# Patient Record
Sex: Male | Born: 1980 | Race: White | Hispanic: No | Smoking: Current every day smoker
Health system: Southern US, Community
[De-identification: ages and names within clinical notes are randomized; demographics above are authoritative.]

## PROBLEM LIST (undated history)

## (undated) DIAGNOSIS — F319 Bipolar disorder, unspecified: Secondary | ICD-10-CM

## (undated) DIAGNOSIS — F419 Anxiety disorder, unspecified: Secondary | ICD-10-CM

## (undated) DIAGNOSIS — J45909 Unspecified asthma, uncomplicated: Secondary | ICD-10-CM

## (undated) DIAGNOSIS — K219 Gastro-esophageal reflux disease without esophagitis: Secondary | ICD-10-CM

## (undated) DIAGNOSIS — E039 Hypothyroidism, unspecified: Secondary | ICD-10-CM

## (undated) DIAGNOSIS — I10 Essential (primary) hypertension: Secondary | ICD-10-CM

## (undated) DIAGNOSIS — E785 Hyperlipidemia, unspecified: Secondary | ICD-10-CM

## (undated) DIAGNOSIS — E079 Disorder of thyroid, unspecified: Secondary | ICD-10-CM

## (undated) DIAGNOSIS — F329 Major depressive disorder, single episode, unspecified: Secondary | ICD-10-CM

## (undated) DIAGNOSIS — F32A Depression, unspecified: Secondary | ICD-10-CM

## (undated) HISTORY — DX: Hyperlipidemia, unspecified: E78.5

## (undated) HISTORY — DX: Bipolar disorder, unspecified: F31.9

## (undated) HISTORY — PX: TONSILLECTOMY: SUR1361

## (undated) HISTORY — DX: Gastro-esophageal reflux disease without esophagitis: K21.9

---

## 2002-03-16 ENCOUNTER — Encounter: Admission: RE | Admit: 2002-03-16 | Discharge: 2002-06-14 | Payer: Self-pay | Admitting: Family Medicine

## 2004-02-08 ENCOUNTER — Emergency Department (HOSPITAL_COMMUNITY): Admission: EM | Admit: 2004-02-08 | Discharge: 2004-02-08 | Payer: Self-pay | Admitting: Emergency Medicine

## 2004-10-28 ENCOUNTER — Inpatient Hospital Stay (HOSPITAL_COMMUNITY): Admission: EM | Admit: 2004-10-28 | Discharge: 2004-10-31 | Payer: Self-pay | Admitting: Psychiatry

## 2004-10-28 ENCOUNTER — Ambulatory Visit: Payer: Self-pay | Admitting: Psychiatry

## 2006-03-06 ENCOUNTER — Emergency Department (HOSPITAL_COMMUNITY): Admission: EM | Admit: 2006-03-06 | Discharge: 2006-03-06 | Payer: Self-pay | Admitting: Emergency Medicine

## 2006-07-05 ENCOUNTER — Inpatient Hospital Stay (HOSPITAL_COMMUNITY): Admission: EM | Admit: 2006-07-05 | Discharge: 2006-07-08 | Payer: Self-pay | Admitting: Emergency Medicine

## 2006-10-04 ENCOUNTER — Ambulatory Visit: Payer: Self-pay | Admitting: Internal Medicine

## 2006-10-06 ENCOUNTER — Ambulatory Visit: Payer: Self-pay | Admitting: *Deleted

## 2006-11-16 ENCOUNTER — Ambulatory Visit: Payer: Self-pay | Admitting: Internal Medicine

## 2007-03-02 ENCOUNTER — Ambulatory Visit: Payer: Self-pay | Admitting: Family Medicine

## 2007-12-29 ENCOUNTER — Ambulatory Visit: Payer: Self-pay | Admitting: Internal Medicine

## 2007-12-29 LAB — CONVERTED CEMR LAB: TSH: 2.297 microintl units/mL (ref 0.350–4.50)

## 2008-05-02 ENCOUNTER — Emergency Department (HOSPITAL_COMMUNITY): Admission: EM | Admit: 2008-05-02 | Discharge: 2008-05-02 | Payer: Self-pay | Admitting: Family Medicine

## 2008-09-13 ENCOUNTER — Emergency Department (HOSPITAL_COMMUNITY): Admission: EM | Admit: 2008-09-13 | Discharge: 2008-09-13 | Payer: Self-pay | Admitting: Family Medicine

## 2008-10-10 ENCOUNTER — Emergency Department (HOSPITAL_COMMUNITY): Admission: EM | Admit: 2008-10-10 | Discharge: 2008-10-10 | Payer: Self-pay | Admitting: Family Medicine

## 2010-08-15 NOTE — Discharge Summary (Signed)
Zachary Dean, Zachary Dean NO.:  [id number]   MEDICAL RECORD NO.:  [id number]          PATIENT TYPE:  INP   LOCATION:  1415                         FACILITY:  [name]   PHYSICIAN:  Lonia Blood, M.D.      DATE OF BIRTH:  [birth year]   DATE OF ADMISSION:  07/05/2006  DATE OF DISCHARGE:  07/08/2006                               DISCHARGE SUMMARY   Primary care physician:  The patient was unassigned to [place].   DISCHARGE DIAGNOSES:  1. Severe depression and agitation with suicidal ideation.  2. Hypothyroidism.  3. Hypertension.   DISCHARGE MEDICATIONS:  1. Alprazolam 1-2 mg p.r.n.  2. Fluoxetine 20 mg daily.  3. Ibuprofen p.r.n.  4. Levothyroxine 50 mcg daily.  5. Wellbutrin.  6. Lamictal.  7. Dextroamphetamine 20 mg q.i.d.  8. Trileptal 12 mg q.h.s.   DISPOSITION:  The patient was transferred to inpatient psychiatric care  voluntarily, but he was not committable.  The patient opted to consider  going on his own.   PROCEDURES PERFORMED:  1. Head CT without contrast on April 7 showed no acute findings.  2. Chest x-ray on April 7 also showed no acute findings.   CONSULTATIONS:  [name], M.D., psychiatry.   BRIEF HISTORY AND PHYSICAL:  Please refer to dictated history and  physical on admission by [facility]. [name], MD.  In short, however,  this is a 30 year old male with known history of some psychiatric  disorder, mainly bipolar, and also polysubstance abuse.  The patient  came in secondary to falling down, continuous confusion.  The patient  while in this state started becoming more or less combative.  There was  no suicidal ideation in the beginning but there was suggestion from his  fiancee that he was extremely depressed.  The patient was subsequently  admitted for further management.   HOSPITAL COURSE:  Problem 1.  SEVERE DEPRESSION, AGITATION:  The patient continued to have  some confusion while in the hospital.  He was seen by psychiatry and his  medications were tampered with.  He was diagnosed with bipolar disorder  by Dr. [name] again.  The initial plan was to commit him for  inpatient psychiatry, but the patient improved on his own without much  problem.  As such, he was given the option of voluntary admission, which  he seemed to have declined.  He was subsequently discharged home to  follow up with inpatient psychiatry.   Problem 2.  HYPOTHYROIDISM:  His thyroid tests were essentially normal  and the patient continues on his home medications.   Problem 3.  HYPERTENSION:  Blood pressure was well-controlled this  hospitalization.   Problem 4.  Other medical problems were also stable this  hospitalization.      Lonia Blood, M.D.  Electronically Signed     LG/MEDQ  D:  08/08/2006  T:  08/09/2006  Job:  [id number]

## 2010-08-15 NOTE — H&P (Signed)
NAMEDANTONIO, JUSTEN NO.:  [id number]   MEDICAL RECORD NO.:  [id number]          PATIENT TYPE:  INP   LOCATION:  1415                         FACILITY:  [facility]   PHYSICIAN:  Beckey Rutter, MD  DATE OF BIRTH:  09-Apr-1980   DATE OF ADMISSION:  07/05/2006  DATE OF DISCHARGE:                              HISTORY & PHYSICAL   PRIMARY CARE PHYSICIAN:  Dr. Verner Chol.   PRIMARY PSYCHIATRIST:  Dr. Clare Gandy.   CHIEF COMPLAINT:  Patient fell down/confusion.   HISTORY OF PRESENT ILLNESS:  This is a 30 year old male with a past  medical history suggestive for psychiatric disorder/bipolar, anxiety,  alcohol abuse, depression, hypertension, and hyperthyroidism, brought  today by his fiancee because of increasing confusion.  She also stated  to the ER physician that the patient fell down.  She was concerned about  a head injury.  Upon questioning the patient himself, he is not sure why  he is in the hospital.  He seems sleepy and not willing to answer  questions regarding history.  No further history could be obtained at  this time.   PAST MEDICAL HISTORY:  1. Alcohol abuse.  2. Anxiety.  3. Bipolar disorder.  4. Depression.  5. Hypertension.  6. Hypothyroidism.   FAMILY HISTORY:  Unremarkable.   SOCIAL HISTORY:  He is a social drinker.  Denies alcohol abuse or  tobacco abuse.   DRUG ALLERGIES:  Not known to have drug allergies.   MEDICATIONS:  1. Alprazolam 1-2 mg daily.  2. _ amphetamines 20 mg four times a day.  3. Fluoxetine 20 mg every morning.  4. Ibuprofen as needed.  5. Levothyroxine 50 mcg daily.  6. Trilafon 12 mg at bedtime.  7. Multivitamin 1 tab daily.  8. Patient used to take Wellbutrin and Lamictal but he ran out of      those two medications for one month.   REVIEW OF SYSTEMS:  Unremarkable.   PHYSICAL EXAMINATION:  VITAL SIGNS:  Temperature 97.7, pulse 97,  respiratory rate 24, blood pressure 133/34.  GENERAL:  He is lying flat, not in  acute distress.  HEENT:  Head is normocephalic and atraumatic.  Eyes:  PERRL.  Mouth:  No  ulcers.  NECK:  No thyroid enlargement.  No lymph node enlargement.  No JVD.  CHEST:  Bilateral fair air entry.  CARDIAC:  Precordial exam:  First and second heart sounds audible.  No  added sounds.  ABDOMEN:  Soft and nontender.  Bowel sounds present.  GU:  No CVA tenderness.  EXTREMITIES:  No edema in the lower extremities.  SKIN:  No hyperpigmentation.  NEUROLOGIC:  Patient seems sleepy.  He is not willing to answer  questions, and he is taking a long time to answer simple questions,  although he does not seem confused, and he answers properly to most of  the questions.  He moves all of his extremities spontaneously.   LABS/X-RAY:  EKG showing tachycardia.  Heart rate 102, sinus rhythm.  No  ST/T changes.   CT scan showing the impression of no acute intracranial pathology.   Sodium 141, potassium  4.1, chloride 104, bicarb 30, BUN 18, creatinine  1.4, glucose 159.  Urine drug screen positive for benzodiazepines and  positive for amphetamines.  Ethanol less than 5%.  Salicylate less than  10%.  Urinalysis essentially negative.   IMPRESSION/RECOMMENDATIONS:  This is a 30 year old man with  hypertension, psychotic disorder, and alcohol abuse, who came today with  confusion.  Likely, the confusion because of the increasing medications,  although there is no suicidal ideation reported at this time.   PLAN:  Patient will be admitted for further assessment and management.  Patient will be admitted to the medical floor.  We will hold his  antipsychotic medication, pending psych evaluation. We will request  psych evaluation and evaluation for psych floor admission.  For his  hypothyroidism, we will check the thyroid function tests. We will  continue on Synthroid 50 mcg daily.  For DVT prophylaxis, will consider  Lovenox.  For GI prophylaxis, will consider Protonix once daily.      Beckey Rutter, MD  Electronically Signed     EME/MEDQ  D:  07/05/2006  T:  07/06/2006  Job:  [id number]

## 2010-08-15 NOTE — Consult Note (Signed)
NAME:  Zachary Dean, Zachary Dean NO.:  [id number]   MEDICAL RECORD NO.:  [id number]          PATIENT TYPE:  INP   LOCATION:  1415                         FACILITY:  [facility]   PHYSICIAN:  [name], M.D.  DATE OF BIRTH:  01/07/1981   DATE OF CONSULTATION:  07/06/2006  DATE OF DISCHARGE:                                 CONSULTATION   REQUESTING PHYSICIAN:  Hettie Holstein, D.O.   REASON FOR CONSULTATION:  Severe depression.   HISTORY OF PRESENT ILLNESS:  [name] is a 30 year old male  admitted to [facility] on April 7 after being found down  confused.   [name] has relapsed on alcohol.  He has several weeks of  depressed mood, anhedonia, poor concentration, decreased energy.  He  does not have any thoughts of harming himself or others.  He has no  hallucinations or delusions.  He has no racing thoughts.   However, he cannot perform his activities of daily living and has just  been lying in his bed with poor energy and no motivation for self-care.   PAST PSYCHIATRIC HISTORY:  [name] has a history of periods of  increased energy, decreased need for sleep, racing thoughts, and  increased mood.  These have occurred at an unknown frequency.  He states  they have lasted for a number of days.  He also has a history of one  suicide attempt approximately 4 years ago.   He does have a history of an admission to [facility] in  August 2006.  He took an overdose on Xanax and Restoril at that time in  a clear suicide attempt.  He was also drinking beer at that time.   He has seen an outpatient psychiatrist.  He was treated with Prozac and  Trilafon.  He also has received Depakote.  He denies any history of  hallucinations or delusions.   FAMILY PSYCHIATRIC HISTORY:  None known.   SOCIAL HISTORY:  [name] has a fiancee who lives in [place]  and a mother who lives in [place].  His father is deceased.  His  father  passed away 2 years ago.  He does not have any children.  He does  have a history of repeated abuse of alcohol.   GENERAL MEDICAL PROBLEMS:  1. Hypothyroidism.  2. Hypertension.   MEDICATIONS:  The MAR is reviewed.  The patient is on Synthroid 50 mcg  daily.   No known drug allergies.   LABORATORY DATA:  The BUN is 18, creatinine 1.46, calcium 8.8.  Magnesium 2.2.  TSH within normal limits at 0.439.  Tylenol negative.  His urine drug screen was positive for amphetamines and benzodiazepines.  His alcohol was negative.   His head CT on April 7 without contrast showed no acute findings.   REVIEW OF SYSTEMS:  Noncontributory.   EXAMINATION:  VITAL SIGNS:  Temperature 98.1, pulse 87, respirations 18,  blood pressure 118/69, O2 saturation on room air 100%.  MENTAL STATUS EXAM:  Zachary Dean is a young male appearing his  chronologic age, lying in a supine position in his hospital  bed.  He is  obviously socially withdrawn.  His concentration is poor.  He makes  great effort to answer the questions.  He has poverty of speech.  Thought content is positive for hopelessness and helplessness.  He  denies any suicidal thoughts.  He has no thoughts of harming others, has  no delusions or hallucinations.  His affect is very constricted.  His  mood is very depressed.  His fund of knowledge and intelligence are  grossly within normal limits.  Thought process is logical, coherent,  goal-directed.  No looseness of associations.  He is oriented to all  spheres and his memory is intact to immediate, recent and remote.  Insight is partial.  His judgment is intact for the need of treatment.  He is well-groomed.   ASSESSMENT:  Axis I:  296.80, bipolar disorder, not otherwise specified; rule out  polysubstance dependence.  Axis II:  Deferred.  Axis III:  See general medical problems.  Axis IV:  Primary support group.  Axis V:  30.   Zachary Dean did have improvement with his prior psychiatric  care;  however, he lost his job, lost his insurance, and could not afford his  psychotropic medications.   Currently the patient does require inpatient psychiatric admission due  to his inability to perform activities of daily living.  Also, he could  benefit from a dual-diagnosis admission given his substance abuse.   RECOMMENDATIONS:  1. Therefore, would admit the patient to an inpatient psychiatric unit      when medically cleared.  2. Would ask a Child psychotherapist to pursue county sponsorship for an      admission to a local psychiatric unit for dual-diagnosis course of      treatment.  3. Psychotropic medication deferred.   It is recommended that the psychiatrist who prescribes his next  medication regimen take into account his lack of insurance and the  psychotropics that are available at low cost within the community.      [name], M.D.  Electronically Signed     JW/MEDQ  D:  07/06/2006  T:  07/06/2006  Job:  [id number]

## 2010-08-15 NOTE — Discharge Summary (Signed)
NAMEMarland Kitchen  JOHNEL, YIELDING NO.:  [id number]   MEDICAL RECORD NO.:  [id number]          PATIENT TYPE:  IPS   LOCATION:  0504                          FACILITY:  [facility]   PHYSICIAN:  Geoffery Lyons, M.D.      DATE OF BIRTH:  September 23, 1980   DATE OF ADMISSION:  10/28/2004  DATE OF DISCHARGE:  10/31/2004                                 DISCHARGE SUMMARY   CHIEF COMPLAINT AND PRESENT ILLNESS:  This was the first admission to [facility] for this 30 year old white male, single, voluntarily  admitted.  He took an overdose of Xanax and Restoril, saying there was no  reason to go on.  Increased depression since the death of his father by  suicide in December of 2005.  Drinking 24 ounces of beer daily, taking Xanax  and Restoril.   PAST PSYCHIATRIC HISTORY:  Seeing Milford Cage.  Has been on Prozac.  He  has also been on treatment for his ADHD and anxiety.   ALCOHOL/DRUG HISTORY:  Had been abusing alcohol, more so after the death of  his father.  No other substances.   MEDICAL HISTORY:  Hypothyroidism.   MEDICATIONS:  Xanax 2 mg in the morning, Restoril 15 mg, 2 or 3 at night,  Prozac 80 mg per day, Depakote ER 500 mg in the morning.   PHYSICAL EXAMINATION:  Performed and failed to show any acute findings.   LABORATORY DATA:  CBC within normal limits.  Blood chemistry within normal  limits.  Liver enzymes with SGOT 30, SGPT 33, total bilirubin 1.3, TSH  2.147.  Valproic acid level 27.2.   MENTAL STATUS EXAM:  Fully alert male.  Somewhat blunted affect.  Some  psychomotor retardation.  Slow response.  No tremors, appears sedated.  Speech slow, soft, decreased in amount.  Mood depressed.  Affect depressed.  Thought processes logical, thought-blocking, suicidal ruminations, no active  plan.  No homicidal ideation.  No evidence of delusions.  No hallucinations.  Cognition was well-preserved.   ADMISSION DIAGNOSES:  AXIS I:  Major depression, recurrent.  Alcohol  abuse.  Attention-deficit hyperactivity disorder.  Anxiety disorder not otherwise  specified.  AXIS II:  No diagnosis.  AXIS III:  Status post polypharmacy overdose.  AXIS IV:  Moderate.  AXIS V:  GAF upon admission 25; highest GAF in the last year 65.   HOSPITAL COURSE:  He was admitted.  He was started in individual and group  psychotherapy.  He was started on Depakote ER 500 mg daily, Ambien 5 mg at  night for sleep, Levothroid 50 mcg daily.  He was given Librium 25 mg every  six hours as needed for withdrawal.  Placed on Prozac 20 mg per day.  Eventually, Depakote was discontinued but it was restarted.  Shavon is a 30-  year-old with extensive history of psychiatric disorder, ADHD, mood  disorder, alcohol and benzodiazepine abuse, anxiety disorder not otherwise  specified, worse since his father committed suicide.  Since then, increased  depression, increased use of alcohol and benzodiazepines.  Mood was  depression.  Affect was constricted.  Feeling overwhelmed, grieving  the  death of the father.  He apparently overdosed on his Xanax 20 tabs of 1 mg  and Restoril 7 tabs of 15 mg.  He had two quarts of beer and one bottle of  Robitussin.  Endorsed the losses, the grief, problems with finances.  Endorsed using 12 beers daily, using cannabis only once every two weeks or  once a month.  He continued to deal with the death of the father and  admitted he got dependent on the alcohol and the benzodiazepines as a way to  cope.  Describes a lot of anxiety.  Uneasy around people.  Avoidant.  We  pursued detox.  By August 3rd, he was starting to feel better.  Still  reserved, guarded, not sleeping well, tiredness, avoidance, actively  grieving.  We facilitated the grief work.  We worked on Pharmacologist,  relapse prevention and, on August 4th, he was in full contact with reality.  There were no suicidal ideation, no homicidal ideation, no hallucinations,  no delusions.  Pretty insightful.  He  endorsed that he needed to open up and  talk about what was going on with him.  He was continued to work on grief  and loss.  He said that he understood that he needed to let it go.  Ready to  do so without benzodiazepines or without marijuana.  He felt clear-minded.  Endorsed this was the best he had felt in a long, long time.  There was a  family session with his mother that was very beneficial.  He was willing to  pursue outpatient treatment.   DISCHARGE DIAGNOSES:  AXIS I:  Major depression.  Polysubstance abuse.  Anxiety disorder not otherwise specified.  AXIS II:  No diagnosis.  AXIS III:  Status post overdose, hypothyroidism.  AXIS IV:  Moderate.  AXIS V:  GAF upon discharge 50-55.   DISCHARGE MEDICATIONS:  1.  Depakote ER 500 mg in the morning.  2.  Prozac 20 mg per day.  3.  Ambien 10 mg at night for sleep.  4.  Synthroid 250 mcg daily.   FOLLOW UP:  Dr. Milford Cage.      Geoffery Lyons, M.D.  Electronically Signed     IL/MEDQ  D:  11/28/2004  T:  11/28/2004  Job:  [id number]

## 2011-07-30 ENCOUNTER — Encounter (HOSPITAL_COMMUNITY): Payer: Self-pay | Admitting: *Deleted

## 2011-07-30 ENCOUNTER — Emergency Department (INDEPENDENT_AMBULATORY_CARE_PROVIDER_SITE_OTHER)
Admission: EM | Admit: 2011-07-30 | Discharge: 2011-07-30 | Disposition: A | Discharge status: Home Health | Payer: 59 | Source: Home / Self Care | Attending: Family Medicine | Admitting: Family Medicine

## 2011-07-30 ENCOUNTER — Emergency Department (INDEPENDENT_AMBULATORY_CARE_PROVIDER_SITE_OTHER): Payer: 59

## 2011-07-30 DIAGNOSIS — G8929 Other chronic pain: Secondary | ICD-10-CM

## 2011-07-30 DIAGNOSIS — M542 Cervicalgia: Secondary | ICD-10-CM

## 2011-07-30 DIAGNOSIS — R209 Unspecified disturbances of skin sensation: Secondary | ICD-10-CM

## 2011-07-30 DIAGNOSIS — M79601 Pain in right arm: Secondary | ICD-10-CM

## 2011-07-30 HISTORY — DX: Hypothyroidism, unspecified: E03.9

## 2011-07-30 HISTORY — DX: Major depressive disorder, single episode, unspecified: F32.9

## 2011-07-30 HISTORY — DX: Disorder of thyroid, unspecified: E07.9

## 2011-07-30 HISTORY — DX: Depression, unspecified: F32.A

## 2011-07-30 HISTORY — DX: Anxiety disorder, unspecified: F41.9

## 2011-07-30 MED ORDER — CYCLOBENZAPRINE HCL 10 MG PO TABS: 10.0000 mg | ORAL_TABLET | Freq: Two times a day (BID) | ORAL | Status: AC | PRN

## 2011-07-30 MED ORDER — CELECOXIB 100 MG PO CAPS: 100.0000 mg | ORAL_CAPSULE | Freq: Two times a day (BID) | ORAL | Status: AC

## 2011-07-30 NOTE — ED Notes (Signed)
States both hands palm and back of hands and arms up to elbows anteriorly are "crazy" numb and painful.  Pt states if he bends elbow he gets numb quickly.  Pt states he hasn't noticed any change in strength but wife states he has dropped some dishes.  Pt. Plays guitar as a hobby.  Also c/o feeling like his neck feels "weak" - "doesn't feel steady"

## 2011-07-30 NOTE — Discharge Instructions (Signed)
Your neck x-rays are normal with no acute bone injuries or fractures in your neck x-rays.  Your neuro examination is grossly normal and does not suggest acute neurologic condition like stroke etc.  Your capillary blood sugar is normal tonight. The best test for diabetes screening is to check fasting blood sugar. Diabetes is not the only cause of peripheral neuropathy. Also conditions like thyroid disease, anemia, nutritional deficiencies etc. There is a possibility you have carpal tunnel syndrome in both hands/arms. I recommend that you get wrist splints and use them as much as possible during the day and if not uncomfortable at nighttime for at least one week to see if it helps improving her symptoms. There other tests to assess for carpal tunnel syndrome which is also consider a peripheral nerve disease. Take the prescribed medications as instructed. You need to followup with your primary care provider for further workup and appropriate referrals to subspecialties if needed.

## 2011-07-30 NOTE — ED Provider Notes (Signed)
History     CSN: [id number]  Arrival date & time 07/30/11  2440   First MD Initiated Contact with Patient 07/30/11 1947      Chief Complaint  Patient presents with  . Numbness    (Consider location/radiation/quality/duration/timing/severity/associated sxs/prior treatment) HPI Comments: 31 year old male smoker with history of mood disorder. Here complaining of chronic neck pain for years with radiation to both arms forearms and hands. State he has had hand tingling and numbness of the hands and forearms also for years but worsening in the last month. States his "neck feels weak" patient denies upper extremity weakness but as per wife "started to drop dishes from his hands". Denies similar symptoms in his legs, denies fever or general malaise, no pain in other body areas. No headache, visual changes, gait or balance problems. 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   Past Medical History  Diagnosis Date  . Thyroid disease   . Anxiety   . Depression   . Hypothyroid     Past Surgical History  Procedure Date  . Tonsillectomy     Family History  Problem Relation Age of Onset  . Cancer Father   . Diabetes Father     History  Substance Use Topics  . Smoking status: Current Everyday Smoker  . Smokeless tobacco: Not on file  . Alcohol Use: No      Review of Systems  Constitutional: Negative for fever, chills, diaphoresis, fatigue and unexpected weight change.  HENT: Positive for neck pain. Negative for ear pain, congestion, sore throat, rhinorrhea and neck stiffness.   Cardiovascular: Negative for chest pain, palpitations and leg swelling.  Gastrointestinal: Negative for nausea, vomiting, abdominal pain and diarrhea.  Skin: Negative for rash.  Neurological: Negative for dizziness, tremors, seizures, weakness and headaches.    Allergies  Review of patient's allergies indicates no known allergies.  Home Medications   Current  Outpatient Rx  Name Route Sig Dispense Refill  . AMPHETAMINE-DEXTROAMPHETAMINE 20 MG PO TABS Oral Take 20 mg by mouth 3 (three) times daily.    Marland Kitchen GABAPENTIN 600 MG PO TABS Oral Take 600 mg by mouth QID.    Marland Kitchen OLANZAPINE-FLUOXETINE HCL 6-25 MG PO CAPS Oral Take 1 capsule by mouth every evening.    . CELECOXIB 100 MG PO CAPS Oral Take 1 capsule (100 mg total) by mouth 2 (two) times daily. 30 capsule 0  . CYCLOBENZAPRINE HCL 10 MG PO TABS Oral Take 1 tablet (10 mg total) by mouth 2 (two) times daily as needed for muscle spasms. And neck pain. 15 tablet 0    BP 161/98  Pulse 78  Temp(Src) 99 F (37.2 C) (Oral)  Resp 16  SpO2 99%  Physical Exam  Nursing note and vitals reviewed. Constitutional: He is oriented to person, place, and time. He appears well-developed and well-nourished. No distress.  HENT:  Head: Normocephalic and atraumatic.  Mouth/Throat: Oropharynx is clear and moist. No oropharyngeal exudate.  Eyes: EOM are normal. Pupils are equal, round, and reactive to light. No scleral icterus.  Neck: Normal range of motion. Neck supple. No thyromegaly present.       Negative Spurling test. Reports discomfort with neck movement although fair ROM.  Cardiovascular: Normal rate, regular rhythm and normal heart sounds.   Pulmonary/Chest: Effort normal and breath sounds normal. He has no wheezes. He has no rales.  Musculoskeletal:       Right shoulder: Normal. He exhibits normal range of motion  and normal strength.       Left shoulder: Normal. He exhibits normal range of motion, no tenderness and normal strength.       Right wrist: Normal. He exhibits normal range of motion and no tenderness.       Left wrist: Normal. He exhibits no tenderness.       Cervical back: Normal. He exhibits normal range of motion and no bony tenderness.       Bilateral arm and fore arm exam normal. Both extremities with normal distal pulses and intact superficial and deep sensation.  Neurological: He is alert  and oriented to person, place, and time. He has normal strength and normal reflexes. No cranial nerve deficit or sensory deficit. Coordination and gait normal.  Reflex Scores:      Tricep reflexes are 2+ on the right side and 2+ on the left side.      Bicep reflexes are 2+ on the right side and 2+ on the left side.      positive Phalen's maneuver bilateral.  Skin: No rash noted.    ED Course  Procedures (including critical care time)   Labs Reviewed  GLUCOSE, CAPILLARY   No results found.   1. Neck pain, chronic   2. Paresthesia and pain of both upper extremities       MDM  Normal neck exam and X-rays. Normal capillary glucose.Impress bilateral carpal tunnel syndrome. Reccommended symptomatic treatment, wrist splints as tolerated and follow up with Dr. Lennette Bihari (pcp) for further peripheral neuropathy work up and monitoring of symptoms.        Sharin Grave, MD 08/02/11 1126

## 2014-01-29 IMAGING — CR DG CERVICAL SPINE COMPLETE 4+V
5 series · 5 of 5 positions shown · non-contrast
Comparison: None.

CLINICAL DATA: Chronic neck pain

CERVICAL SPINE - COMPLETE 4+ VIEW

[view not recorded (1 of 5)]
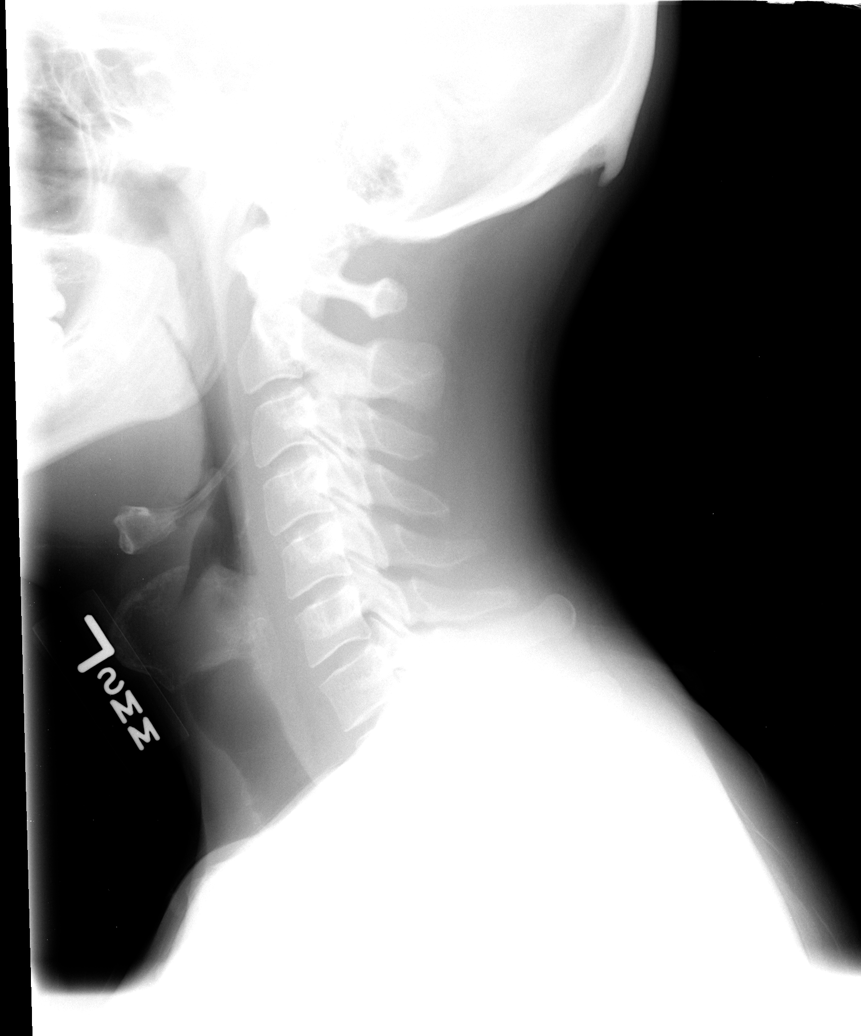

[view not recorded (2 of 5)]
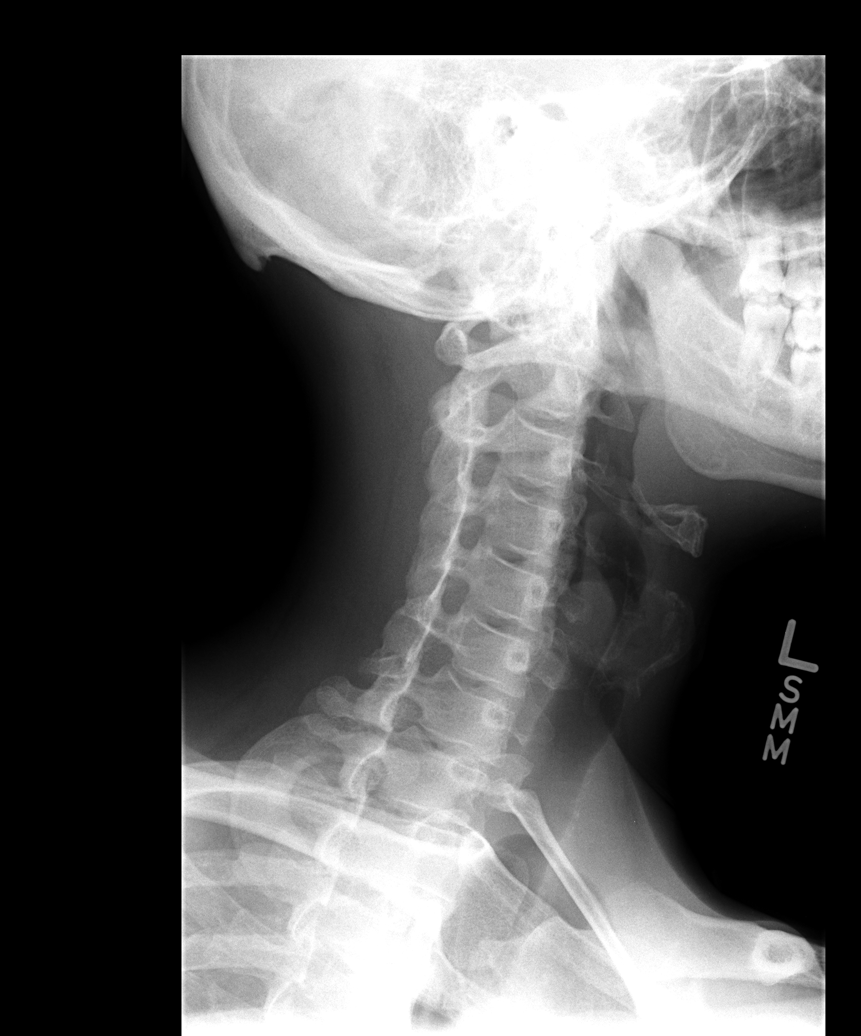

[view not recorded (3 of 5)]
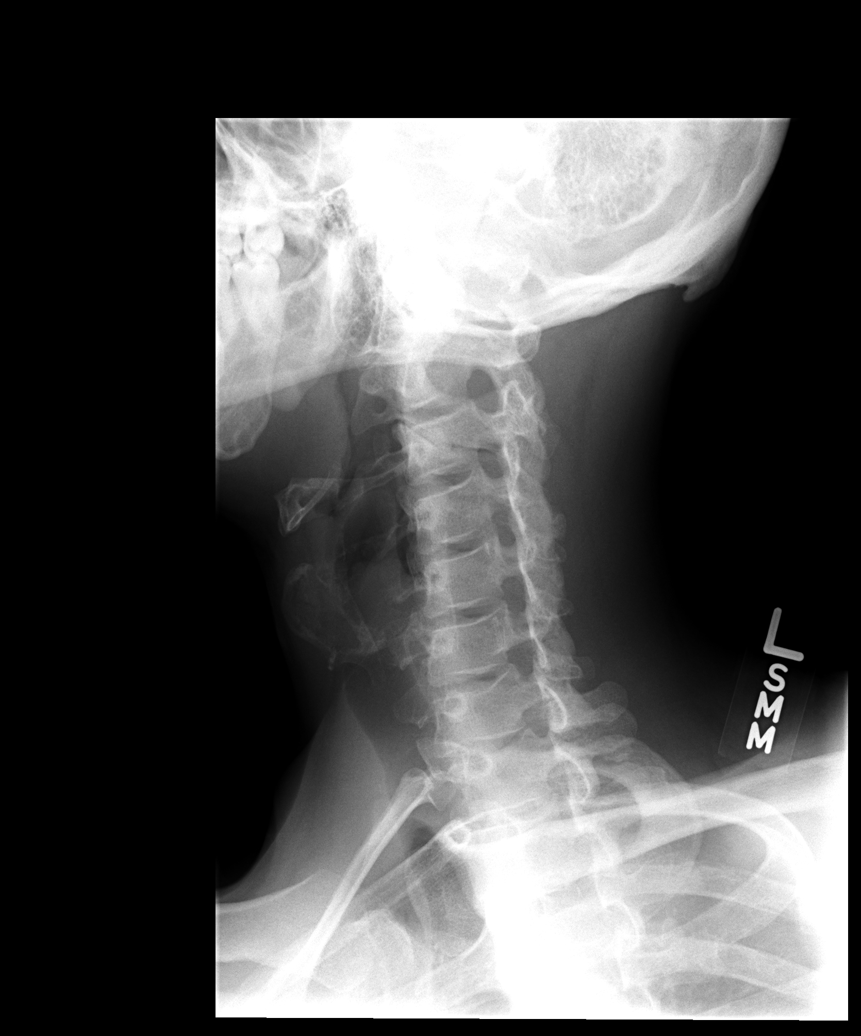

[view not recorded (4 of 5)]
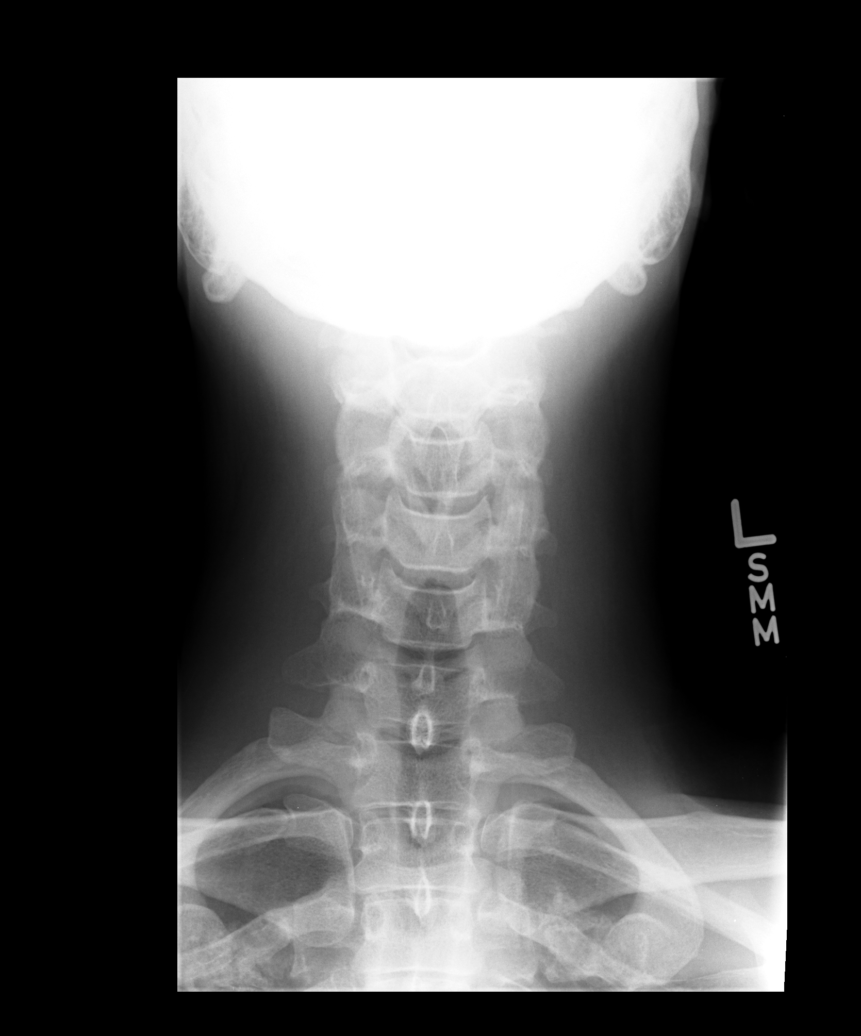

[view not recorded (5 of 5)]
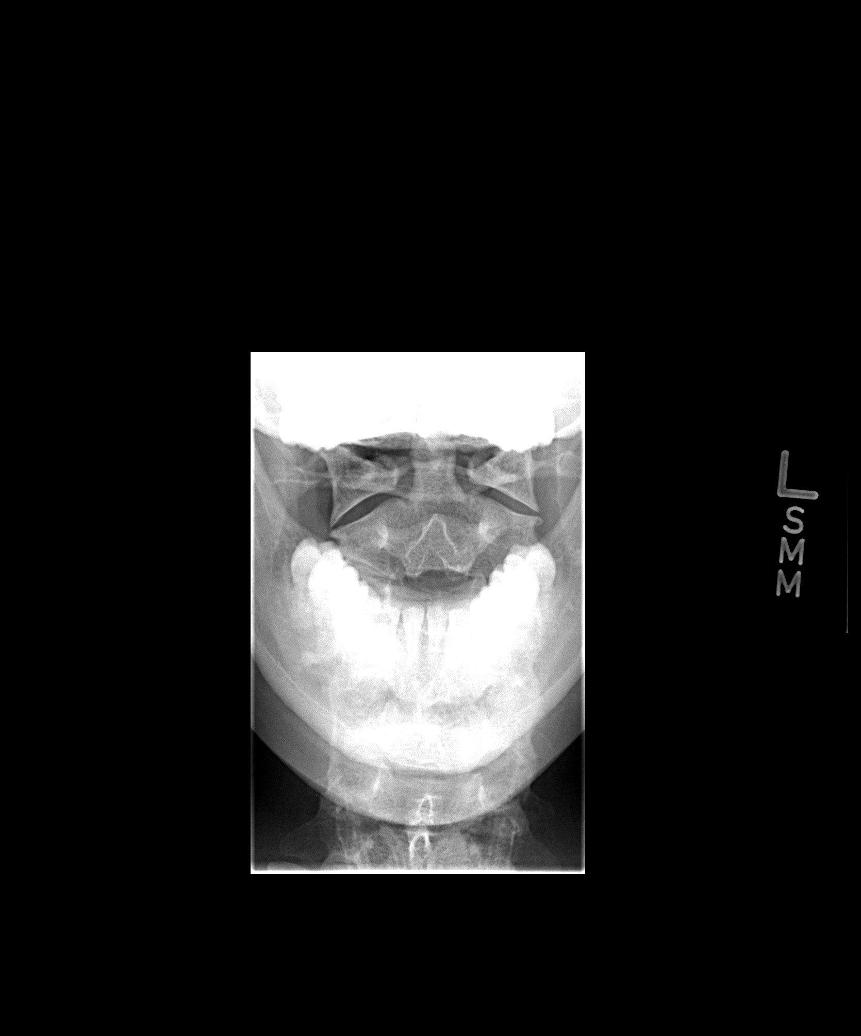

[5 of 5 positions shown; findings below may reference images not displayed]

FINDINGS: The imaged vertebral bodies and inter-vertebral disc
spaces are maintained. No displaced acute fracture or dislocation
identified.   The para-vertebral and overlying soft tissues are
within normal limits.  Maintained craniocervical relationship.
Maintained C1-2 articulation.
IMPRESSION: No acute osseous abnormality.

## 2015-04-01 MED FILL — VIAGRA 100 MG TABLET: 100 | 30 days supply | Qty: 6 | Fill #5

## 2015-04-02 MED FILL — GABAPENTIN 600 MG TABLET: 600 | 90 days supply | Qty: 360 | Fill #0

## 2015-05-06 DIAGNOSIS — F4310 Post-traumatic stress disorder, unspecified: Secondary | ICD-10-CM | POA: Diagnosis not present

## 2015-05-10 MED FILL — DESIPRAMINE 25 MG TABLET: 25 | 90 days supply | Qty: 360 | Fill #0

## 2015-05-10 MED FILL — AMPHETAMINE SALTS 30 MG TAB: 30 | 90 days supply | Qty: 270 | Fill #0

## 2015-05-13 MED FILL — VIAGRA 100 MG TABLET: 100 | 30 days supply | Qty: 6 | Fill #6

## 2015-05-20 DIAGNOSIS — F4310 Post-traumatic stress disorder, unspecified: Secondary | ICD-10-CM | POA: Diagnosis not present

## 2015-06-13 DIAGNOSIS — F4310 Post-traumatic stress disorder, unspecified: Secondary | ICD-10-CM | POA: Diagnosis not present

## 2015-06-21 DIAGNOSIS — F4310 Post-traumatic stress disorder, unspecified: Secondary | ICD-10-CM | POA: Diagnosis not present

## 2015-06-21 MED FILL — FETZIMA ER 40 MG CAPSULE: 40 | 90 days supply | Qty: 90 | Fill #1

## 2015-06-21 MED FILL — FETZIMA ER 20 MG CAPSULE: 20 | 90 days supply | Qty: 90 | Fill #1

## 2015-06-21 MED FILL — rOPINIRole HCL 1 MG TABS: 1 | 90 days supply | Qty: 90 | Fill #1

## 2015-06-21 MED FILL — LEVOTHYROXINE 25 MCG TABLET: 25 | 90 days supply | Qty: 90 | Fill #1

## 2015-06-25 MED FILL — CHLORDIAZEPOXIDE 5 MG CAP: 5 | 90 days supply | Qty: 360 | Fill #0

## 2015-06-27 MED FILL — VIAGRA 100 MG TABLET: 100 | 30 days supply | Qty: 6 | Fill #7

## 2015-06-27 MED FILL — GABAPENTIN 600 MG TABLET: 600 | 90 days supply | Qty: 360 | Fill #1

## 2015-06-28 MED FILL — CYCLOBENZAPRINE 10 MG TAB: 10 | 90 days supply | Qty: 180 | Fill #0

## 2015-07-29 DIAGNOSIS — F331 Major depressive disorder, recurrent, moderate: Secondary | ICD-10-CM | POA: Diagnosis not present

## 2015-07-30 DIAGNOSIS — F4310 Post-traumatic stress disorder, unspecified: Secondary | ICD-10-CM | POA: Diagnosis not present

## 2015-08-06 MED FILL — VIAGRA 100 MG TABLET: 100 | 30 days supply | Qty: 6 | Fill #8

## 2015-08-07 DIAGNOSIS — F4310 Post-traumatic stress disorder, unspecified: Secondary | ICD-10-CM | POA: Diagnosis not present

## 2015-08-16 DIAGNOSIS — F4310 Post-traumatic stress disorder, unspecified: Secondary | ICD-10-CM | POA: Diagnosis not present

## 2015-09-02 DIAGNOSIS — F4310 Post-traumatic stress disorder, unspecified: Secondary | ICD-10-CM | POA: Diagnosis not present

## 2015-09-02 MED FILL — GABAPENTIN 600 MG TABLET: 600 | 90 days supply | Qty: 540 | Fill #0

## 2015-09-04 MED FILL — VIAGRA 100 MG TABLET: 100 | 30 days supply | Qty: 6 | Fill #0

## 2015-09-04 MED FILL — AMPHETAMINE SALTS 30 MG TAB: 30 | 90 days supply | Qty: 270 | Fill #0

## 2015-09-12 DIAGNOSIS — F4310 Post-traumatic stress disorder, unspecified: Secondary | ICD-10-CM | POA: Diagnosis not present

## 2015-09-17 MED FILL — LEVOTHYROXINE 25 MCG TABLET: 25 | 90 days supply | Qty: 90 | Fill #2

## 2015-09-27 DIAGNOSIS — F4310 Post-traumatic stress disorder, unspecified: Secondary | ICD-10-CM | POA: Diagnosis not present

## 2015-10-07 MED FILL — OLANZAPINE-FLUOXETINE 3-25: 3-25 | 90 days supply | Qty: 90 | Fill #0

## 2015-10-07 MED FILL — EQUETRO 100 MG CAPSULE: 100 | 90 days supply | Qty: 180 | Fill #0

## 2015-10-10 MED FILL — VIAGRA 100 MG TABLET: 100 | 30 days supply | Qty: 6 | Fill #1

## 2015-10-22 MED FILL — CHLORDIAZEPOXIDE 5 MG CAP: 5 | 90 days supply | Qty: 360 | Fill #0

## 2015-10-25 DIAGNOSIS — F4310 Post-traumatic stress disorder, unspecified: Secondary | ICD-10-CM | POA: Diagnosis not present

## 2015-11-18 MED FILL — VIAGRA 100 MG TABLET: 100 | 30 days supply | Qty: 6 | Fill #2

## 2015-11-27 MED FILL — rOPINIRole HCL 1 MG TABS: 1 | 90 days supply | Qty: 90 | Fill #2

## 2015-12-09 MED FILL — AMPHETAMINE SALTS 30 MG TAB: 30 | 90 days supply | Qty: 270 | Fill #0

## 2015-12-16 DIAGNOSIS — F4310 Post-traumatic stress disorder, unspecified: Secondary | ICD-10-CM | POA: Diagnosis not present

## 2015-12-30 MED FILL — LEVOTHYROXINE 25 MCG TABLET: 25 | 90 days supply | Qty: 90 | Fill #3

## 2015-12-30 MED FILL — OLANZAPINE-FLUOXETINE 3-25: 3-25 | 90 days supply | Qty: 90 | Fill #1

## 2015-12-30 MED FILL — VIAGRA 100 MG TABLET: 100 | 30 days supply | Qty: 6 | Fill #3

## 2016-01-01 DIAGNOSIS — Z3141 Encounter for fertility testing: Secondary | ICD-10-CM | POA: Diagnosis not present

## 2016-01-27 MED FILL — DESIPRAMINE 25 MG TABLET: 25 | 90 days supply | Qty: 360 | Fill #1

## 2016-01-27 MED FILL — VIAGRA 100 MG TABLET: 100 | 30 days supply | Qty: 6 | Fill #4

## 2016-01-27 MED FILL — CHLORDIAZEPOXIDE 5 MG CAP: 5 | 90 days supply | Qty: 360 | Fill #1

## 2016-01-27 MED FILL — GABAPENTIN 600 MG TABLET: 600 | 90 days supply | Qty: 540 | Fill #1

## 2016-02-18 MED FILL — rOPINIRole HCL 1 MG TABS: 1 | 90 days supply | Qty: 90 | Fill #3

## 2016-02-24 MED FILL — VIAGRA 100 MG TABLET: 100 | 30 days supply | Qty: 6 | Fill #5

## 2016-03-09 MED FILL — AMPHETAMINE SALTS 30 MG TAB: 30 | 90 days supply | Qty: 270 | Fill #0

## 2016-03-16 MED FILL — TOPIRAMATE 25 MG TABLET: 25 | 90 days supply | Qty: 270 | Fill #0

## 2016-03-16 MED FILL — VENLAFAXINE HCL ER 75 MG CA: 75 | 90 days supply | Qty: 180 | Fill #0

## 2016-03-26 MED FILL — LEVOTHYROXINE 25 MCG TABLET: 25 | 90 days supply | Qty: 90 | Fill #0

## 2016-03-26 MED FILL — SILDENAFIL 100 MG TABLET: 100 | 30 days supply | Qty: 6 | Fill #6

## 2016-05-18 MED FILL — rOPINIRole HCL 1 MG TABS: 1 | 90 days supply | Qty: 90 | Fill #0

## 2016-06-08 MED FILL — AMPHETAMINE SALTS 30 MG TAB: 30 | 90 days supply | Qty: 270 | Fill #0

## 2016-06-15 MED FILL — VENLAFAXINE HCL ER 75 MG CA: 75 | 90 days supply | Qty: 180 | Fill #1

## 2016-06-15 MED FILL — LEVOTHYROXINE 25 MCG TABLET: 25 | 90 days supply | Qty: 90 | Fill #1

## 2016-07-04 ENCOUNTER — Emergency Department (HOSPITAL_COMMUNITY)
Admission: EM | Admit: 2016-07-04 | Discharge: 2016-07-05 | Disposition: A | Discharge status: Other Acute Inpatient Hospital | Payer: 59 | Attending: Emergency Medicine | Admitting: Emergency Medicine

## 2016-07-04 ENCOUNTER — Encounter (HOSPITAL_COMMUNITY): Payer: Self-pay | Admitting: *Deleted

## 2016-07-04 DIAGNOSIS — Z79899 Other long term (current) drug therapy: Secondary | ICD-10-CM | POA: Diagnosis not present

## 2016-07-04 DIAGNOSIS — R030 Elevated blood-pressure reading, without diagnosis of hypertension: Secondary | ICD-10-CM | POA: Diagnosis not present

## 2016-07-04 DIAGNOSIS — F17200 Nicotine dependence, unspecified, uncomplicated: Secondary | ICD-10-CM | POA: Insufficient documentation

## 2016-07-04 DIAGNOSIS — F323 Major depressive disorder, single episode, severe with psychotic features: Secondary | ICD-10-CM | POA: Diagnosis not present

## 2016-07-04 DIAGNOSIS — F333 Major depressive disorder, recurrent, severe with psychotic symptoms: Secondary | ICD-10-CM | POA: Diagnosis not present

## 2016-07-04 DIAGNOSIS — F19950 Other psychoactive substance use, unspecified with psychoactive substance-induced psychotic disorder with delusions: Secondary | ICD-10-CM | POA: Diagnosis present

## 2016-07-04 DIAGNOSIS — G2581 Restless legs syndrome: Secondary | ICD-10-CM

## 2016-07-04 DIAGNOSIS — E039 Hypothyroidism, unspecified: Secondary | ICD-10-CM | POA: Insufficient documentation

## 2016-07-04 DIAGNOSIS — R440 Auditory hallucinations: Secondary | ICD-10-CM

## 2016-07-04 DIAGNOSIS — R9431 Abnormal electrocardiogram [ECG] [EKG]: Secondary | ICD-10-CM | POA: Diagnosis not present

## 2016-07-04 DIAGNOSIS — F411 Generalized anxiety disorder: Secondary | ICD-10-CM | POA: Diagnosis present

## 2016-07-04 LAB — COMPREHENSIVE METABOLIC PANEL
ALBUMIN: 4.3 g/dL (ref 3.5–5.0)
ALK PHOS: 61 U/L (ref 38–126)
ALT: 21 U/L (ref 17–63)
ANION GAP: 5 (ref 5–15)
AST: 26 U/L (ref 15–41)
BUN: 16 mg/dL (ref 6–20)
CHLORIDE: 109 mmol/L (ref 101–111)
CO2: 26 mmol/L (ref 22–32)
Calcium: 8.9 mg/dL (ref 8.9–10.3)
Creatinine, Ser: 0.69 mg/dL (ref 0.61–1.24)
GFR calc Af Amer: >60 mL/min (ref >60)
GFR calc non Af Amer: >60 mL/min (ref >60)
GLUCOSE: 102 mg/dL — AB (ref 65–99)
POTASSIUM: 3.8 mmol/L (ref 3.5–5.1)
SODIUM: 140 mmol/L (ref 135–145)
Total Bilirubin: 0.6 mg/dL (ref 0.3–1.2)
Total Protein: 6.9 g/dL (ref 6.5–8.1)

## 2016-07-04 LAB — RAPID URINE DRUG SCREEN, HOSP PERFORMED
AMPHETAMINES: POSITIVE — AB
BENZODIAZEPINES: POSITIVE — AB
Barbiturates: NOT DETECTED
Cocaine: NOT DETECTED
Opiates: NOT DETECTED
TETRAHYDROCANNABINOL: POSITIVE — AB

## 2016-07-04 LAB — CBC
HEMATOCRIT: 37.8 % — AB (ref 39.0–52.0)
HEMOGLOBIN: 13.1 g/dL (ref 13.0–17.0)
MCH: 32.8 pg (ref 26.0–34.0)
MCHC: 34.7 g/dL (ref 30.0–36.0)
MCV: 94.5 fL (ref 78.0–100.0)
Platelets: 211 10*3/uL (ref 150–400)
RBC: 4 MIL/uL — AB (ref 4.22–5.81)
RDW: 12.8 % (ref 11.5–15.5)
WBC: 9.5 10*3/uL (ref 4.0–10.5)

## 2016-07-04 LAB — ETHANOL: Alcohol, Ethyl (B): <5 mg/dL (ref <5)

## 2016-07-04 MED ORDER — GABAPENTIN 300 MG PO CAPS
600.0000 mg | ORAL_CAPSULE | Freq: Four times a day (QID) | ORAL | Status: DC
  Administered 2016-07-04: 600 mg via ORAL
  Filled 2016-07-04: qty 2

## 2016-07-04 MED ORDER — OLANZAPINE-FLUOXETINE HCL 6-25 MG PO CAPS
1.0000 | ORAL_CAPSULE | Freq: Every evening | ORAL | Status: DC
  Administered 2016-07-04: 1 via ORAL
  Filled 2016-07-04: qty 1

## 2016-07-04 MED ORDER — NICOTINE 21 MG/24HR TD PT24
21.0000 mg | MEDICATED_PATCH | Freq: Every day | TRANSDERMAL | Status: DC
  Administered 2016-07-04 – 2016-07-05 (×2): 21 mg via TRANSDERMAL
  Filled 2016-07-04 (×3): qty 1

## 2016-07-04 MED ORDER — AMPHETAMINE-DEXTROAMPHETAMINE 20 MG PO TABS
20.0000 mg | ORAL_TABLET | Freq: Three times a day (TID) | ORAL | Status: DC
  Administered 2016-07-04 – 2016-07-05 (×2): 20 mg via ORAL
  Filled 2016-07-04 (×2): qty 1

## 2016-07-04 NOTE — ED Provider Notes (Signed)
[facility] DEPT Provider Note   CSN: [id number] Arrival date & time: 07/04/16  1603     History   Chief Complaint Chief Complaint  Patient presents with  . Medical Clearance  . Hallucinations   Level V caveat psychiatric complaint history is obtained from patient and from patient's wife patient was sent from Dr.Kaur's office, his psychiatrist where he was seen earlier today. Patient has been hallucinating for one week. With auditory hallucinations. He has been highly paranoid and delusional, feels that his Internet and in his house is hacked and extremely agitated. Dr. Toy Care and the patient's wife feel that he needs to be admitted to the hospital for psychiatric evaluation and further treatment. He was treated with Librium 25 mg at 3:50 PM today. Patient has vague thoughts of suicide though states "I wouldn't do it" HPI Zachary Dean is a 36 y.o. male.  HPI  Past Medical History:  Diagnosis Date  . Anxiety   . Depression   . Hypothyroid   . Thyroid disease     There are no active problems to display for this patient.   Past Surgical History:  Procedure Laterality Date  . TONSILLECTOMY         Home Medications    Prior to Admission medications   Medication Sig Start Date End Date Taking? Authorizing Provider  amphetamine-dextroamphetamine (ADDERALL) 20 MG tablet Take 20 mg by mouth 3 (three) times daily.    Historical Provider, MD  gabapentin (NEURONTIN) 600 MG tablet Take 600 mg by mouth QID.    Historical Provider, MD  olanzapine-FLUoxetine (SYMBYAX) 6-25 MG per capsule Take 1 capsule by mouth every evening.    Historical Provider, MD    Family History Family History  Problem Relation Age of Onset  . Cancer Father   . Diabetes Father     Social History Social History  Substance Use Topics  . Smoking status: Current Every Day Smoker  . Smokeless tobacco: Never Used  . Alcohol use No     Allergies   Patient has no known  allergies.   Review of Systems Review of Systems  Unable to perform ROS: Psychiatric disorder  Psychiatric/Behavioral: Positive for agitation, hallucinations and suicidal ideas. The patient is nervous/anxious.      Physical Exam Updated Vital Signs BP (!) 165/124   Pulse 73   Temp 99 F (37.2 C) (Oral)   Resp 18   Wt 125 lb (56.7 kg)   SpO2 100%   Physical Exam  Constitutional: He is oriented to person, place, and time. He appears well-developed and well-nourished. He appears distressed.  Tearful, mildly agitated. Consolable  HENT:  Head: Normocephalic and atraumatic.  Eyes: Conjunctivae are normal. Pupils are equal, round, and reactive to light.  Neck: Neck supple. No tracheal deviation present. No thyromegaly present.  Cardiovascular: Normal rate and regular rhythm.   No murmur heard. Pulmonary/Chest: Effort normal and breath sounds normal.  Abdominal: Soft. Bowel sounds are normal. He exhibits no distension. There is no tenderness.  Musculoskeletal: Normal range of motion. He exhibits no edema or tenderness.  Neurological: He is alert and oriented to person, place, and time. Coordination normal.  Gait normal  Skin: Skin is warm and dry. No rash noted.  Psychiatric: He has a normal mood and affect.  Nursing note and vitals reviewed.  Results for orders placed or performed during the hospital encounter of 07/04/16  Comprehensive metabolic panel  Result Value Ref Range   Sodium 140 135 - 145 mmol/L  Potassium 3.8 3.5 - 5.1 mmol/L   Chloride 109 101 - 111 mmol/L   CO2 26 22 - 32 mmol/L   Glucose, Bld 102 (H) 65 - 99 mg/dL   BUN 16 6 - 20 mg/dL   Creatinine, Ser 0.69 0.61 - 1.24 mg/dL   Calcium 8.9 8.9 - 10.3 mg/dL   Total Protein 6.9 6.5 - 8.1 g/dL   Albumin 4.3 3.5 - 5.0 g/dL   AST 26 15 - 41 U/L   ALT 21 17 - 63 U/L   Alkaline Phosphatase 61 38 - 126 U/L   Total Bilirubin 0.6 0.3 - 1.2 mg/dL   GFR calc non Af Amer >60 >60 mL/min   GFR calc Af Amer >60 >60  mL/min   Anion gap 5 5 - 15  Ethanol  Result Value Ref Range   Alcohol, Ethyl (B) <5 <5 mg/dL  cbc  Result Value Ref Range   WBC 9.5 4.0 - 10.5 K/uL   RBC 4.00 (L) 4.22 - 5.81 MIL/uL   Hemoglobin 13.1 13.0 - 17.0 g/dL   HCT 37.8 (L) 39.0 - 52.0 %   MCV 94.5 78.0 - 100.0 fL   MCH 32.8 26.0 - 34.0 pg   MCHC 34.7 30.0 - 36.0 g/dL   RDW 12.8 11.5 - 15.5 %   Platelets 211 150 - 400 K/uL  Rapid urine drug screen (hospital performed)  Result Value Ref Range   Opiates NONE DETECTED NONE DETECTED   Cocaine NONE DETECTED NONE DETECTED   Benzodiazepines POSITIVE (A) NONE DETECTED   Amphetamines POSITIVE (A) NONE DETECTED   Tetrahydrocannabinol POSITIVE (A) NONE DETECTED   Barbiturates NONE DETECTED NONE DETECTED   No results found.  ED Treatments / Results  Labs (all labs ordered are listed, but only abnormal results are displayed) Labs Reviewed  COMPREHENSIVE METABOLIC PANEL - Abnormal; Notable for the following:       Result Value   Glucose, Bld 102 (*)    All other components within normal limits  CBC - Abnormal; Notable for the following:    RBC 4.00 (*)    HCT 37.8 (*)    All other components within normal limits  RAPID URINE DRUG SCREEN, HOSP PERFORMED - Abnormal; Notable for the following:    Benzodiazepines POSITIVE (*)    Amphetamines POSITIVE (*)    Tetrahydrocannabinol POSITIVE (*)    All other components within normal limits  ETHANOL    EKG  EKG Interpretation  Date/Time:  Saturday July 04 2016 18:50:58 EDT Ventricular Rate:  77 PR Interval:    QRS Duration: 97 QT Interval:  385 QTC Calculation: 436 R Axis:   125 Text Interpretation:  Sinus rhythm Right axis deviation ST elev, probable normal early repol pattern Baseline wander in lead(s) I aVR No significant change since last tracing Confirmed by Winfred Leeds  MD, Vonita Calloway [phone]) on 07/04/2016 6:55:26 PM      Results for orders placed or performed during the hospital encounter of 07/04/16  Comprehensive  metabolic panel  Result Value Ref Range   Sodium 140 135 - 145 mmol/L   Potassium 3.8 3.5 - 5.1 mmol/L   Chloride 109 101 - 111 mmol/L   CO2 26 22 - 32 mmol/L   Glucose, Bld 102 (H) 65 - 99 mg/dL   BUN 16 6 - 20 mg/dL   Creatinine, Ser 0.69 0.61 - 1.24 mg/dL   Calcium 8.9 8.9 - 10.3 mg/dL   Total Protein 6.9 6.5 - 8.1 g/dL   Albumin 4.3  3.5 - 5.0 g/dL   AST 26 15 - 41 U/L   ALT 21 17 - 63 U/L   Alkaline Phosphatase 61 38 - 126 U/L   Total Bilirubin 0.6 0.3 - 1.2 mg/dL   GFR calc non Af Amer >60 >60 mL/min   GFR calc Af Amer >60 >60 mL/min   Anion gap 5 5 - 15  Ethanol  Result Value Ref Range   Alcohol, Ethyl (B) <5 <5 mg/dL  cbc  Result Value Ref Range   WBC 9.5 4.0 - 10.5 K/uL   RBC 4.00 (L) 4.22 - 5.81 MIL/uL   Hemoglobin 13.1 13.0 - 17.0 g/dL   HCT 37.8 (L) 39.0 - 52.0 %   MCV 94.5 78.0 - 100.0 fL   MCH 32.8 26.0 - 34.0 pg   MCHC 34.7 30.0 - 36.0 g/dL   RDW 12.8 11.5 - 15.5 %   Platelets 211 150 - 400 K/uL  Rapid urine drug screen (hospital performed)  Result Value Ref Range   Opiates NONE DETECTED NONE DETECTED   Cocaine NONE DETECTED NONE DETECTED   Benzodiazepines POSITIVE (A) NONE DETECTED   Amphetamines POSITIVE (A) NONE DETECTED   Tetrahydrocannabinol POSITIVE (A) NONE DETECTED   Barbiturates NONE DETECTED NONE DETECTED   No results found.  Radiology No results found.  Procedures Procedures (including critical care time)  Medications Ordered in ED Medications  amphetamine-dextroamphetamine (ADDERALL) tablet 20 mg (not administered)  gabapentin (NEURONTIN) tablet 600 mg (not administered)  OLANZapine-FLUoxetine (SYMBYAX) 6-25 MG per capsule 1 capsule (not administered)    . Initial Impression / Assessment and Plan / ED Course  I have reviewed the triage vital signs and the nursing notes.  Pertinent labs & imaging results that were available during my care of the patient were reviewed by me and considered in my medical decision making (see chart for  details).     Patient is medically clear for psychiatric evaluation. Elevated blood pressure likely secondary to agitation Final Clinical Impressions(s) / ED Diagnoses  Diagnosis #1 auditory hallucinations 2 elevated blood pressure Final diagnoses:  None    New Prescriptions New Prescriptions   No medications on file     Orlie Dakin, MD 07/04/16 1857

## 2016-07-04 NOTE — ED Triage Notes (Addendum)
Pt wife reports pt has had paranoia, delusions, hallucinations for the past week. Pt was sent by psychiatrist to be admitted. Note from doctor reports pt is agitated easily and is not safe at home. Pt denies SI/HI.  Pt was  Given librium at 1550 today.

## 2016-07-04 NOTE — ED Notes (Signed)
Bed: WA32 Expected date:  Expected time:  Means of arrival:  Comments: 

## 2016-07-05 ENCOUNTER — Encounter (HOSPITAL_COMMUNITY): Payer: Self-pay | Admitting: *Deleted

## 2016-07-05 ENCOUNTER — Inpatient Hospital Stay (HOSPITAL_COMMUNITY)
Admission: AD | Admit: 2016-07-05 | Discharge: 2016-07-11 | DRG: 897 | Disposition: A | Discharge status: Home / Self Care | Payer: 59 | Source: Intra-hospital | Attending: Psychiatry | Admitting: Psychiatry

## 2016-07-05 DIAGNOSIS — R030 Elevated blood-pressure reading, without diagnosis of hypertension: Secondary | ICD-10-CM | POA: Diagnosis not present

## 2016-07-05 DIAGNOSIS — F333 Major depressive disorder, recurrent, severe with psychotic symptoms: Secondary | ICD-10-CM | POA: Diagnosis not present

## 2016-07-05 DIAGNOSIS — Z915 Personal history of self-harm: Secondary | ICD-10-CM

## 2016-07-05 DIAGNOSIS — Z9889 Other specified postprocedural states: Secondary | ICD-10-CM | POA: Diagnosis not present

## 2016-07-05 DIAGNOSIS — Z833 Family history of diabetes mellitus: Secondary | ICD-10-CM

## 2016-07-05 DIAGNOSIS — E039 Hypothyroidism, unspecified: Secondary | ICD-10-CM | POA: Diagnosis not present

## 2016-07-05 DIAGNOSIS — Z79899 Other long term (current) drug therapy: Secondary | ICD-10-CM

## 2016-07-05 DIAGNOSIS — Z818 Family history of other mental and behavioral disorders: Secondary | ICD-10-CM

## 2016-07-05 DIAGNOSIS — F19950 Other psychoactive substance use, unspecified with psychoactive substance-induced psychotic disorder with delusions: Secondary | ICD-10-CM | POA: Diagnosis not present

## 2016-07-05 DIAGNOSIS — F17200 Nicotine dependence, unspecified, uncomplicated: Secondary | ICD-10-CM | POA: Diagnosis not present

## 2016-07-05 DIAGNOSIS — G2581 Restless legs syndrome: Secondary | ICD-10-CM | POA: Diagnosis not present

## 2016-07-05 DIAGNOSIS — F411 Generalized anxiety disorder: Secondary | ICD-10-CM | POA: Diagnosis present

## 2016-07-05 DIAGNOSIS — F17210 Nicotine dependence, cigarettes, uncomplicated: Secondary | ICD-10-CM | POA: Diagnosis not present

## 2016-07-05 DIAGNOSIS — Z809 Family history of malignant neoplasm, unspecified: Secondary | ICD-10-CM

## 2016-07-05 MED ORDER — LORAZEPAM 0.5 MG PO TABS
0.5000 mg | ORAL_TABLET | Freq: Three times a day (TID) | ORAL | Status: DC | PRN
  Filled 2016-07-05: qty 1

## 2016-07-05 MED ORDER — VENLAFAXINE HCL ER 150 MG PO CP24
150.0000 mg | ORAL_CAPSULE | Freq: Every day | ORAL | Status: DC
  Administered 2016-07-06 – 2016-07-11 (×6): 150 mg via ORAL
  Filled 2016-07-05 (×8): qty 1

## 2016-07-05 MED ORDER — GABAPENTIN 300 MG PO CAPS: 300.0000 mg | ORAL_CAPSULE | Freq: Three times a day (TID) | ORAL | Status: DC

## 2016-07-05 MED ORDER — LEVOTHYROXINE SODIUM 25 MCG PO TABS
25.0000 ug | ORAL_TABLET | Freq: Every day | ORAL | Status: DC
  Filled 2016-07-05: qty 1

## 2016-07-05 MED ORDER — OLANZAPINE 5 MG PO TBDP
5.0000 mg | ORAL_TABLET | Freq: Three times a day (TID) | ORAL | Status: DC | PRN
  Administered 2016-07-05 – 2016-07-06 (×2): 5 mg via ORAL
  Filled 2016-07-05 (×2): qty 1

## 2016-07-05 MED ORDER — ACETAMINOPHEN 325 MG PO TABS: 650.0000 mg | ORAL_TABLET | Freq: Four times a day (QID) | ORAL | Status: DC | PRN

## 2016-07-05 MED ORDER — LEVOTHYROXINE SODIUM 25 MCG PO TABS
25.0000 ug | ORAL_TABLET | Freq: Every day | ORAL | Status: DC
  Administered 2016-07-06 – 2016-07-11 (×6): 25 ug via ORAL
  Filled 2016-07-05 (×9): qty 1

## 2016-07-05 MED ORDER — VENLAFAXINE HCL ER 75 MG PO CP24
150.0000 mg | ORAL_CAPSULE | Freq: Every day | ORAL | Status: DC
  Administered 2016-07-05: 150 mg via ORAL
  Filled 2016-07-05: qty 2

## 2016-07-05 MED ORDER — ROPINIROLE HCL 1 MG PO TABS: 1.0000 mg | ORAL_TABLET | Freq: Every day | ORAL | Status: DC

## 2016-07-05 MED ORDER — ROPINIROLE HCL 1 MG PO TABS
1.0000 mg | ORAL_TABLET | Freq: Every day | ORAL | Status: DC
  Administered 2016-07-05 – 2016-07-06 (×2): 1 mg via ORAL
  Filled 2016-07-05 (×4): qty 1

## 2016-07-05 MED ORDER — HYDROXYZINE HCL 25 MG PO TABS: 25.0000 mg | ORAL_TABLET | Freq: Four times a day (QID) | ORAL | Status: DC | PRN

## 2016-07-05 MED ORDER — ADULT MULTIVITAMIN W/MINERALS CH
1.0000 | ORAL_TABLET | Freq: Every day | ORAL | Status: DC
  Administered 2016-07-05 – 2016-07-11 (×7): 1 via ORAL
  Filled 2016-07-05 (×10): qty 1

## 2016-07-05 MED ORDER — MAGNESIUM HYDROXIDE 400 MG/5ML PO SUSP
30.0000 mL | Freq: Every day | ORAL | Status: DC | PRN
  Administered 2016-07-07: 30 mL via ORAL
  Filled 2016-07-05: qty 30

## 2016-07-05 MED ORDER — ROPINIROLE HCL 0.5 MG PO TABS: 0.5000 mg | ORAL_TABLET | Freq: Every day | ORAL | Status: DC

## 2016-07-05 MED ORDER — TOPIRAMATE 25 MG PO TABS
50.0000 mg | ORAL_TABLET | Freq: Every day | ORAL | Status: DC
  Administered 2016-07-05: 50 mg via ORAL
  Filled 2016-07-05 (×4): qty 2

## 2016-07-05 NOTE — BH Assessment (Signed)
Per [facility] Ria Comment) patient accepted to [facility] Bed 304-2.  Patient signed voluntary admission form and consent to release information.  Writer faxed consent  voluntary admission form and consent to release information to [facility].  Writer provided the original forms to the nurse working with the patient. The patient is able to come to [facility] at 2:30pm, per the [facility] Mendel Ryder).

## 2016-07-05 NOTE — ED Notes (Signed)
Patient tearful and stating "I am freaking out." Patient requesting anxiety medication. Psych provider aware. Waiting for new orders. Caffeine free Coke given to patient per request.

## 2016-07-05 NOTE — Progress Notes (Signed)
Zachary Dean is a 36 yo caucasian male who is admitted today voluntarily from [facility] ED, where his wife took him today due to anxiety , pacing and inability to sleep.  He has a PMH of shizophrenia, is unable to remember if he has been taking his emdication correctly but is obviously responding to internal stimuli. 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 His admissionis completed he is taken to his room and orders obtained.

## 2016-07-05 NOTE — BH Assessment (Addendum)
Tele Assessment Note   Zachary Dean is an 36 y.o. male who presents to the ED at the recommendation of his psychiatrist. Pt appears anxious during the assessment, looking around the room as if to be responding to internal stimuli. Pt appears to be expressing thought blocking during the assessment speaking in partial sentences such as "I feel like. I feel like. Problems escalated. My brain needs to catch up. I just feel. I just feel. I am running out of options. I just try and get." Pt often speaking and stops talking abruptly during the assessment. Pt stated "I just have a lot of mental fatigue." Pt clinching his face and moving his head rapidly during the assessment while he is experiencing thought blocking.   Pt states he has increased stress at work but did not disclose the type of stress he is experiencing. Pt reports his employer requested an evaluation before he can return to work. Pt reports he uses marijuana daily when he gets home from work in order to "get out of a bad head space." Pt reports he feels "frustrated, constant frustration." Pt has a hx of inpt hospitalization in 2006 and reports a hx of EDMR "years ago." Pt reports his psychiatrist Dr. Robina Ade recommended an evaluation "to see if he is fit to go to work."  Per Lindon Romp, NP pt meets inpt criteria. TTS to seek placement. Report given to Dr. Thomasene Lot, MD who is in agreement with disposition.    Diagnosis: Generalized Anxiety D/O; Cannabis Use D/O; hx of MDD  Past Medical History:  Past Medical History:  Diagnosis Date   Anxiety    Depression    Hypothyroid    Thyroid disease     Past Surgical History:  Procedure Laterality Date   TONSILLECTOMY      Family History:  Family History  Problem Relation Age of Onset   Cancer Father    Diabetes Father     Social History:  reports that he has been smoking.  He has never used smokeless tobacco. He reports that he does not drink alcohol or use  drugs.  Additional Social History:  Alcohol / Drug Use Pain Medications: See PTA meds Prescriptions: See PTA meds Over the Counter: See PTA meds History of alcohol / drug use?: Yes Substance #1 Name of Substance 1: Marijuana  1 - Age of First Use: 15 1 - Amount (size/oz): .25 of an oz a week  1 - Frequency: daily  1 - Duration: ongoing 1 - Last Use / Amount: 07/04/16  CIWA: CIWA-Ar BP: 136/88 Pulse Rate: 68 COWS:    PATIENT STRENGTHS: (choose at least two) Average or above average intelligence Financial means General fund of knowledge Supportive family/friends Work skills  Allergies: No Known Allergies  Home Medications:  (Not in a hospital admission)  OB/GYN Status:  No LMP for male patient.  General Assessment Data Location of Assessment: [facility] TTS Assessment: In system Is this a Tele or Face-to-Face Assessment?: Tele Assessment Is this an Initial Assessment or a Re-assessment for this encounter?: Initial Assessment Marital status: Married Is patient pregnant?: No Pregnancy Status: No Living Arrangements: Spouse/significant other Can pt return to current living arrangement?: Yes Admission Status: Voluntary Is patient capable of signing voluntary admission?: Yes Referral Source: Psychiatrist Insurance type: [place] Living Arrangements: Spouse/significant other Name of Psychiatrist: Dr. Robina Ade Name of Therapist: none  Education Status Is patient currently in school?: No Highest grade of  school patient has completed: Associates Degree  Risk to self with the past 6 months Suicidal Ideation: No Has patient been a risk to self within the past 6 months prior to admission? : No Suicidal Intent: No Has patient had any suicidal intent within the past 6 months prior to admission? : No Is patient at risk for suicide?: No Suicidal Plan?: No Has patient had any suicidal plan within the past 6 months prior to admission? : No Access  to Means: No What has been your use of drugs/alcohol within the last 12 months?: reports to daily marijuana use  Previous Attempts/Gestures: No Triggers for Past Attempts: None known Intentional Self Injurious Behavior: None Family Suicide History: No Recent stressful life event(s): Other (Comment) (work stress) Persecutory voices/beliefs?: No Depression: No Substance abuse history and/or treatment for substance abuse?: No Suicide prevention information given to non-admitted patients: Not applicable  Risk to Others within the past 6 months Homicidal Ideation: No Does patient have any lifetime risk of violence toward others beyond the six months prior to admission? : No Thoughts of Harm to Others: No Current Homicidal Intent: No Current Homicidal Plan: No Access to Homicidal Means: No History of harm to others?: No Assessment of Violence: None Noted Does patient have access to weapons?: No Criminal Charges Pending?: No Does patient have a court date: No Is patient on probation?: No  Psychosis Hallucinations: None noted Delusions: Unspecified  Mental Status Report Appearance/Hygiene: Disheveled, Bizarre Eye Contact: Poor Motor Activity: Mannerisms, Tremors Speech: Word salad, Rapid Level of Consciousness: Quiet/awake Mood: Anxious Affect: Anxious, Constricted Anxiety Level: Moderate Thought Processes: Thought Blocking Judgement: Partial Orientation: Person, Place, Time, Appropriate for developmental age Obsessive Compulsive Thoughts/Behaviors: None  Cognitive Functioning Concentration: Poor Memory: Remote Intact, Recent Intact IQ: Average Insight: Fair Impulse Control: Good Appetite: Good Sleep: No Change Total Hours of Sleep: 8 Vegetative Symptoms: None  ADLScreening [facility] Assessment Services) Patient's cognitive ability adequate to safely complete daily activities?: Yes Patient able to express need for assistance with ADLs?: Yes Independently performs ADLs?:  Yes (appropriate for developmental age)  Prior Inpatient Therapy Prior Inpatient Therapy: Yes Prior Therapy Dates: 2006 Prior Therapy Facilty/Provider(s): [facility] Reason for Treatment: unknown  Prior Outpatient Therapy Prior Outpatient Therapy: Yes Prior Therapy Dates: current Prior Therapy Facilty/Provider(s): Dr. Robina Ade Reason for Treatment: Med Management Does patient have an ACCT team?: No Does patient have Intensive In-House Services?  : No Does patient have [facility] services? : No Does patient have P4CC services?: No  ADL Screening (condition at time of admission) Patient's cognitive ability adequate to safely complete daily activities?: Yes Is the patient deaf or have difficulty hearing?: No Does the patient have difficulty seeing, even when wearing glasses/contacts?: No Does the patient have difficulty concentrating, remembering, or making decisions?: Yes Patient able to express need for assistance with ADLs?: Yes Does the patient have difficulty dressing or bathing?: No Independently performs ADLs?: Yes (appropriate for developmental age) Does the patient have difficulty walking or climbing stairs?: No Weakness of Legs: None Weakness of Arms/Hands: None  Home Assistive Devices/Equipment Home Assistive Devices/Equipment: None    Abuse/Neglect Assessment (Assessment to be complete while patient is alone) Physical Abuse: Denies Verbal Abuse: Denies Sexual Abuse: Denies Exploitation of patient/patient's resources: Denies Self-Neglect: Denies     Regulatory affairs officer (For Healthcare) Does Patient Have a Medical Advance Directive?: No Would patient like information on creating a medical advance directive?: No - Patient declined    Additional Information 1:1 In Past 12 Months?: No CIRT Risk:  No Elopement Risk: No Does patient have medical clearance?: Yes     Disposition:  Disposition Initial Assessment Completed for this Encounter: Yes Disposition of Patient:  Inpatient treatment program Type of inpatient treatment program: Adult (per Lindon Romp, NP)  Lyanne Co 07/05/2016 12:54 AM

## 2016-07-05 NOTE — Tx Team (Signed)
Initial Treatment Plan 07/05/2016 7:05 PM Zachary Dean RAJ:[id number]    PATIENT STRESSORS: Educational concerns Financial difficulties Health problems   PATIENT STRENGTHS: Ability for insight Active sense of humor   PATIENT IDENTIFIED PROBLEMS: Psychosis  Bipolar Schizophrenia            " I love my family"  " I take my medicines"     DISCHARGE CRITERIA:  Ability to meet basic life and health needs Adequate post-discharge living arrangements  PRELIMINARY DISCHARGE PLAN: Attend aftercare/continuing care group Attend PHP/IOP  PATIENT/FAMILY INVOLVEMENT: This treatment plan has been presented to and reviewed with the patient, Zachary Dean, and/or family member, .  The patient and family have been given the opportunity to ask questions and make suggestions.  Lauralyn Primes, RN 07/05/2016, 7:05 PM

## 2016-07-05 NOTE — ED Notes (Signed)
Patient is cooperative and calm. States "I see a psychiatrist already and my wife called him and told him that I was having hallucinations and was paranoid. It was probably the drugs. I just need to get some help because I have to do better." Patient denies current hallucinations, SI/HI.

## 2016-07-05 NOTE — ED Notes (Signed)
[facility] at bedside to transport patient to [facility]. 

## 2016-07-05 NOTE — Progress Notes (Signed)
D: Pt was in bed in his room upon initial approach.  He presents with anxious affect and mood.  Pt exhibits thought blocking.  He has isolated to his room for the majority of the night.  Denies SI/HI, denies hallucinations, denies pain.  Reports feeling better since he took PRN Zyprexa at change of shift.  Reports he had a good visit with his mother and his wife.    A: Introduced self to pt.  Encouragement and support offered.  Medication administered per order.  Q15 minute safety checks maintained.  R: Pt is compliant with medications.  He is safe on the unit and he verbally contracts for safety.  Will continue to monitor and assess.

## 2016-07-05 NOTE — ED Notes (Signed)
Patient resting quietly in bed at this time. Will re-assess need for anxiety medication.

## 2016-07-05 NOTE — Plan of Care (Signed)
Problem: Safety: Goal: Periods of time without injury will increase Outcome: Progressing Pt has not harmed self or others tonight.  Denies SI/HI and verbally contracts for safety.

## 2016-07-06 DIAGNOSIS — F411 Generalized anxiety disorder: Secondary | ICD-10-CM

## 2016-07-06 DIAGNOSIS — Z79899 Other long term (current) drug therapy: Secondary | ICD-10-CM

## 2016-07-06 DIAGNOSIS — F333 Major depressive disorder, recurrent, severe with psychotic symptoms: Secondary | ICD-10-CM

## 2016-07-06 DIAGNOSIS — G2581 Restless legs syndrome: Secondary | ICD-10-CM

## 2016-07-06 MED ORDER — GABAPENTIN 300 MG PO CAPS
300.0000 mg | ORAL_CAPSULE | Freq: Three times a day (TID) | ORAL | Status: DC
  Administered 2016-07-07 – 2016-07-11 (×13): 300 mg via ORAL
  Filled 2016-07-06 (×21): qty 1

## 2016-07-06 MED ORDER — OLANZAPINE 5 MG PO TBDP
ORAL_TABLET | ORAL | Status: AC
  Filled 2016-07-06: qty 1

## 2016-07-06 MED ORDER — TOPIRAMATE 25 MG PO TABS
25.0000 mg | ORAL_TABLET | Freq: Every day | ORAL | Status: DC
  Administered 2016-07-06: 25 mg via ORAL
  Filled 2016-07-06 (×2): qty 1

## 2016-07-06 MED ORDER — OLANZAPINE 5 MG PO TABS
5.0000 mg | ORAL_TABLET | Freq: Two times a day (BID) | ORAL | Status: DC
  Administered 2016-07-06: 5 mg via ORAL
  Filled 2016-07-06 (×2): qty 1
  Filled 2016-07-06: qty 2

## 2016-07-06 MED ORDER — NICOTINE POLACRILEX 2 MG MT GUM
2.0000 mg | CHEWING_GUM | OROMUCOSAL | Status: DC | PRN
  Administered 2016-07-06: 2 mg via ORAL

## 2016-07-06 MED ORDER — OLANZAPINE 5 MG PO TBDP
5.0000 mg | ORAL_TABLET | Freq: Two times a day (BID) | ORAL | Status: DC
  Administered 2016-07-06 – 2016-07-07 (×2): 5 mg via ORAL
  Filled 2016-07-06 (×4): qty 1

## 2016-07-06 MED ORDER — NICOTINE 21 MG/24HR TD PT24
21.0000 mg | MEDICATED_PATCH | Freq: Every day | TRANSDERMAL | Status: DC
  Administered 2016-07-06 – 2016-07-11 (×6): 21 mg via TRANSDERMAL
  Filled 2016-07-06 (×7): qty 1

## 2016-07-06 NOTE — Progress Notes (Signed)
Patient ID: Zachary Dean, male   DOB: [birth year], 36 y.o.   MRN: [id number]  DAR: Pt. Denies SI/HI and A/V Hallucinations. He appears suspicious and cautious during interaction with Probation officer. He reports sleep is good, appetite is good, energy level is normal, and concentration is poor. He rates hopelessness 0/10 and anxiety 2/10. Before dinner patient reported difficulty urinating however patient reports, "it's emotional." He appeared to be crying at this time. He was offered the tub room which is more private. Patient was in there a couple minutes and did not report any further difficulty. Support and encouragement provided to the patient to come to writer with questions or concerns. Scheduled medications and PRN Zyprexa administered to patient per physician's orders. He reports feeling better after his PRN Zyprexa. However, this evening patient and his family members reported that the scheduled Zyprexa was not as effective. He was offered PRN Vistaril however patient reported he can't take antihistamines due to intolerance which causes restless legs. Patient has been minimal and isolated to his room for majority of the day. Q15 minute checks are maintained for safety.

## 2016-07-06 NOTE — H&P (Signed)
Psychiatric Admission Assessment Adult  Patient Identification: Zachary Dean MRN:  [id number] Date of Evaluation:  07/06/2016 Chief Complaint:  " I am not sure"  Principal Diagnosis: Major depressive disorder, recurrent episode, severe, with psychosis ([place]) Diagnosis:   Patient Active Problem List   Diagnosis Date Noted  . Major depressive disorder, recurrent episode, severe, with psychosis ([place]) [F33.3] 07/05/2016  . GAD (generalized anxiety disorder) [F41.1] 07/05/2016  . Restless leg syndrome [G25.81] 07/05/2016  . MDD (major depressive disorder), recurrent, severe, with psychosis ([place]) [F33.3] 07/05/2016   History of Present Illness: 36 year old male . Patient is a limited historian, and has difficulty explaining symptoms or events that led to admission . States that his work's HR requested a medical evaluation to determine whether he can return to work. States " they are worried that I have mental fatigue". He states he made appointment and saw Dr. Toy Care for this purpose, and states that he was encouraged to come to the hospital , and wife agreed/ brought  him to the hospital .  Patient denies any hallucinations or psychotic symptoms, but reports state that patient has been presenting with thought blocking and at times has appeared internally preoccupied. Patient endorses regular cannabis abuse, denies any alcohol or other drug abuse . Admission UDS was positive for amphetamines, benzodiazepines, cannabis. BAL <5.  With patient's express consent I obtained collateral information from wife. She reports patient is normally high functioning , but that over the last several days to weeks he has been paranoid, delusional , worrying that his internet and phones are being hacked into, telling her there are suspicious footprints outside the home. She has also noted him to be less organized in his speech, and describes more fragmented speech. She states that he had a prior episode about a  year ago, similar to this one , but not as severe, and that at that time he responded very well to Zyprexa . Of note,wife states that Synthroid has been prescribed to patient as antidepressant augmentation, not for underlying hypothyroidism  Associated Signs/Symptoms: Depression Symptoms: of note, denies anhedonia or significant sadness, states sleep has been good, does report decreased energy level and decreased appetite  (Hypo) Manic Symptoms:   None  Anxiety Symptoms:  Describes excessive worrying  Psychotic Symptoms: denies hallucinations, at this time does not appear internally preoccupied. Possible thought blocking  PTSD Symptoms: Does not endorse  Total Time spent with patient: 45 minutes  Past Psychiatric History:  One prior psychiatric admission in 2006, for depression and suicidal attempt-overdosed on Xanax . Denies any history of self cutting or self injurious behaviors, denies history of violence.Does not endorse any clear history of mania or hypomania.  As reported by wife, has had prior episode of psychosis, which responded to Zyprexa   Is the patient at risk to self? Yes.    Has the patient been a risk to self in the past 6 months? No.  Has the patient been a risk to self within the distant past? Yes.    Is the patient a risk to others? No.  Has the patient been a risk to others in the past 6 months? No.  Has the patient been a risk to others within the distant past? No.   Prior Inpatient Therapy:  as above  Prior Outpatient Therapy:  had been referred to Dr. Toy Care   Alcohol Screening: 1. How often do you have a drink containing alcohol?: Never 9. Have you or someone else been injured  as a result of your drinking?: No Brief Intervention: AUDIT score less than 7 or less-screening does not suggest unhealthy drinking-brief intervention not indicated Substance Abuse History in the last 12 months:  Denies alcohol abuse, cannabis abuse, was smoking regularly,  Consequences of  Substance Abuse: Denies  Previous Psychotropic Medications: Neurontin, Topamax ,Effexor XR , Librium. States had been on Symbiax in the past, but had not been taking recently .  Psychological Evaluations:  No  Past Medical History: as below. States he has been taking thyroid medication regularly . NKDA.  Past Medical History:  Diagnosis Date  . Anxiety   . Depression   . Hypothyroid   . Thyroid disease     Past Surgical History:  Procedure Laterality Date  . TONSILLECTOMY     Family History:  Mother is alive, father is deceased, has one sister  Family History  Problem Relation Age of Onset  . Cancer Father   . Diabetes Father    Family Psychiatric  History: father committed suicide in 2005. States that there is a history of alcohol abuse in his extended family. He states he thinks his mother has history of depression . Tobacco Screening: Have you used any form of tobacco in the last 30 days? (Cigarettes, Smokeless Tobacco, Cigars, and/or Pipes): Yes Tobacco use, Select all that apply: 5 or more cigarettes per day Are you interested in Tobacco Cessation Medications?: No, patient refused Counseled patient on smoking cessation including recognizing danger situations, developing coping skills and basic information about quitting provided: Refused/Declined practical counseling Social History: married x 8 years, no children, employed ( works in a Proofreader), denies legal issues, denies any acute stressors  History  Alcohol use Not on file     History  Drug use: Unknown    Additional Social History:      Pain Medications: none History of alcohol / drug use?: No history of alcohol / drug abuse Negative Consequences of Use: Personal relationships, Work / Youth worker Withdrawal Symptoms: Aggressive/Assaultive  Allergies:  No Known Allergies Lab Results:  Results for orders placed or performed during the hospital encounter of 07/04/16 (from the past 48 hour(s))  Rapid urine drug screen  (hospital performed)     Status: Abnormal   Collection Time: 07/04/16  4:57 PM  Result Value Ref Range   Opiates NONE DETECTED NONE DETECTED   Cocaine NONE DETECTED NONE DETECTED   Benzodiazepines POSITIVE (A) NONE DETECTED   Amphetamines POSITIVE (A) NONE DETECTED   Tetrahydrocannabinol POSITIVE (A) NONE DETECTED   Barbiturates NONE DETECTED NONE DETECTED    Comment:        DRUG SCREEN FOR MEDICAL PURPOSES ONLY.  IF CONFIRMATION IS NEEDED FOR ANY PURPOSE, NOTIFY LAB WITHIN 5 DAYS.        LOWEST DETECTABLE LIMITS FOR URINE DRUG SCREEN Drug Class       Cutoff (ng/mL) Amphetamine      1000 Barbiturate      200 Benzodiazepine   952 Tricyclics       841 Opiates          300 Cocaine          300 THC              50   Comprehensive metabolic panel     Status: Abnormal   Collection Time: 07/04/16  5:16 PM  Result Value Ref Range   Sodium 140 135 - 145 mmol/L   Potassium 3.8 3.5 - 5.1 mmol/L   Chloride 109 101 -  111 mmol/L   CO2 26 22 - 32 mmol/L   Glucose, Bld 102 (H) 65 - 99 mg/dL   BUN 16 6 - 20 mg/dL   Creatinine, Ser 0.69 0.61 - 1.24 mg/dL   Calcium 8.9 8.9 - 10.3 mg/dL   Total Protein 6.9 6.5 - 8.1 g/dL   Albumin 4.3 3.5 - 5.0 g/dL   AST 26 15 - 41 U/L   ALT 21 17 - 63 U/L   Alkaline Phosphatase 61 38 - 126 U/L   Total Bilirubin 0.6 0.3 - 1.2 mg/dL   GFR calc non Af Amer >60 >60 mL/min   GFR calc Af Amer >60 >60 mL/min    Comment: (NOTE) The eGFR has been calculated using the CKD EPI equation. This calculation has not been validated in all clinical situations. eGFR's persistently <60 mL/min signify possible Chronic Kidney Disease.    Anion gap 5 5 - 15  cbc     Status: Abnormal   Collection Time: 07/04/16  5:16 PM  Result Value Ref Range   WBC 9.5 4.0 - 10.5 K/uL   RBC 4.00 (L) 4.22 - 5.81 MIL/uL   Hemoglobin 13.1 13.0 - 17.0 g/dL   HCT 37.8 (L) 39.0 - 52.0 %   MCV 94.5 78.0 - 100.0 fL   MCH 32.8 26.0 - 34.0 pg   MCHC 34.7 30.0 - 36.0 g/dL   RDW 12.8 11.5 -  15.5 %   Platelets 211 150 - 400 K/uL  Ethanol     Status: None   Collection Time: 07/04/16  5:17 PM  Result Value Ref Range   Alcohol, Ethyl (B) <5 <5 mg/dL    Comment:        LOWEST DETECTABLE LIMIT FOR SERUM ALCOHOL IS 5 mg/dL FOR MEDICAL PURPOSES ONLY     Blood Alcohol level:  Lab Results  Component Value Date   ETH <5 64/40/3474    Metabolic Disorder Labs:  No results found for: HGBA1C, MPG No results found for: PROLACTIN No results found for: CHOL, TRIG, HDL, CHOLHDL, VLDL, LDLCALC  Current Medications: Current Facility-Administered Medications  Medication Dose Route Frequency Provider Last Rate Last Dose  . acetaminophen (TYLENOL) tablet 650 mg  650 mg Oral Q6H PRN Shuvon B Rankin, NP      . hydrOXYzine (ATARAX/VISTARIL) tablet 25-50 mg  25-50 mg Oral Q6H PRN Benjamine Mola, FNP      . levothyroxine (SYNTHROID, LEVOTHROID) tablet 25 mcg  25 mcg Oral QAC breakfast Shuvon B Rankin, NP   25 mcg at 07/06/16 0609  . magnesium hydroxide (MILK OF MAGNESIA) suspension 30 mL  30 mL Oral Daily PRN Shuvon B Rankin, NP      . multivitamin with minerals tablet 1 tablet  1 tablet Oral Daily Shuvon B Rankin, NP   1 tablet at 07/06/16 0752  . OLANZapine zydis (ZYPREXA) disintegrating tablet 5 mg  5 mg Oral Q8H PRN Benjamine Mola, FNP   5 mg at 07/06/16 0840  . rOPINIRole (REQUIP) tablet 1 mg  1 mg Oral QHS Shuvon B Rankin, NP   1 mg at 07/05/16 2109  . topiramate (TOPAMAX) tablet 50 mg  50 mg Oral QHS Shuvon B Rankin, NP   50 mg at 07/05/16 2109  . venlafaxine XR (EFFEXOR-XR) 24 hr capsule 150 mg  150 mg Oral Q breakfast Shuvon B Rankin, NP   150 mg at 07/06/16 2595   PTA Medications: Prescriptions Prior to Admission  Medication Sig Dispense Refill Last Dose  .  B Complex Vitamins (B-COMPLEX/B-12 PO) Take 1 tablet by mouth daily.     . chlordiazePOXIDE (LIBRIUM) 5 MG capsule Take 10 mg by mouth at bedtime.     . gabapentin (NEURONTIN) 600 MG tablet Take 600 mg by mouth 4 (four) times  daily.     Marland Kitchen ibuprofen (ADVIL,MOTRIN) 200 MG tablet Take 800-1,000 mg by mouth every 6 (six) hours as needed for headache.     . levothyroxine (SYNTHROID, LEVOTHROID) 25 MCG tablet Take 25 mcg by mouth daily.  4   . Multiple Vitamin (MULTIVITAMIN WITH MINERALS) TABS tablet Take 1 tablet by mouth daily.   Unknown at Unknown time  . olanzapine-FLUoxetine (SYMBYAX) 6-25 MG per capsule Take 1 capsule by mouth every evening.   Unknown at Unknown time  . OVER THE COUNTER MEDICATION Take 1 tablet by mouth 3 (three) times daily. Hydroxycut   Unknown at Unknown time  . rOPINIRole (REQUIP) 1 MG tablet Take 1 mg by mouth at bedtime.  4 Unknown at Unknown time  . topiramate (TOPAMAX) 25 MG tablet Take 50 mg by mouth at bedtime.   Unknown at Unknown time  . venlafaxine XR (EFFEXOR-XR) 75 MG 24 hr capsule Take 75 mg by mouth 2 (two) times daily.  4 Unknown at Unknown time    Musculoskeletal: Strength & Muscle Tone: within normal limits Gait & Station: normal Patient leans: N/A  Psychiatric Specialty Exam: Physical Exam  Review of Systems  Constitutional: Negative.   HENT: Negative.   Eyes: Negative.   Respiratory: Negative.   Gastrointestinal: Negative.   Genitourinary: Negative.   Musculoskeletal: Negative.   Skin: Negative.   Neurological: Negative for seizures.  Endo/Heme/Allergies: Negative.   Psychiatric/Behavioral: Positive for depression, hallucinations and substance abuse.  All other systems reviewed and are negative.   Blood pressure 126/74, pulse 83, temperature 97.5 F (36.4 C), temperature source Oral, resp. rate 16, height _0  (1.702 m), weight 53.1 kg (117 lb), SpO2 100 %.Body mass index is 18.32 kg/m.  General Appearance: Fairly Groomed  Eye Contact:  Minimal  Speech:  Slow- appears to thought block at times , increased latency of speech, often answers questions with incomplete sentences, or trails off while speaking   Volume:  Decreased  Mood:  minimizes depression,  presents vaguely sad, anxious  Affect:  anxious  Thought Process:  Disorganized and Descriptions of Associations: Circumstantial- slowed,   Orientation:  Full (Time, Place, and Person)- although presents with slowed thought process, there is no current presentation of delirium and he is oriented x 3.  Thought Content:  denies hallucinations, no delusions expressed at this time - although denies hallucinations, at times appears to be responding to internal stimuli - recent paranoid ideations, delusions as described by wife - see above   Suicidal Thoughts:  Denies any suicidal ideations, denies any self injurious ideations, contracts for safety on unit   Homicidal Thoughts:  No  Memory:  Recall 3/3 immediate , 3/3 at 3 minutes   Judgement:  Fair  Insight:  Fair  Psychomotor Activity:  Decreased  Concentration:  Concentration: Fair and Attention Span: Fair  Recall:  Good  Fund of Knowledge:  Fair  Language:  Fair  Akathisia:  Negative  Handed:  Left  AIMS (if indicated):     Assets:  Desire for Improvement Resilience  ADL's: fair   Cognition:  WNL  Sleep:  Number of Hours: 6.75    Treatment Plan Summary: Daily contact with patient to assess and evaluate symptoms and progress  in treatment, Medication management, Plan inpatient admission  and medications as below  Observation Level/Precautions:  Daily contact with patient to assess and evaluate symptoms and progress in treatment, Medication management, Plan inpatient treatment  and medications as below  Laboratory:  as needed   Psychotherapy: milieu, group therapy    Medications:  Based on history of good response and tolerance to Zyprexa  Will start Olanzapine 5 mgrs BID.  Continue Effexor XR , currently at 150 mgrs QDAY . Decrease Topamax to 25 mgrs QDAY, as could be contributing to cognitive side effects.  Continue Synthroid 25 micrograms QDAY    Consultations:  As needed   Discharge Concerns:  -  Estimated LOS: 6 days   Other:      Physician Treatment Plan for Primary Diagnosis: Major depressive disorder, recurrent episode, severe, with psychosis ([place]) Long Term Goal(s): Improvement in symptoms so as ready for discharge  Short Term Goals: Ability to verbalize feelings will improve, Ability to disclose and discuss suicidal ideas, Ability to demonstrate self-control will improve, Ability to identify and develop effective coping behaviors will improve and Ability to maintain clinical measurements within normal limits will improve  Physician Treatment Plan for Secondary Diagnosis: Principal Problem:   Major depressive disorder, recurrent episode, severe, with psychosis ([place]) Active Problems:   GAD (generalized anxiety disorder)   Restless leg syndrome   MDD (major depressive disorder), recurrent, severe, with psychosis ([place])  Long Term Goal(s): Improvement in symptoms so as ready for discharge  Short Term Goals: Ability to verbalize feelings will improve, Ability to disclose and discuss suicidal ideas, Ability to demonstrate self-control will improve, Ability to identify and develop effective coping behaviors will improve and Ability to maintain clinical measurements within normal limits will improve  I certify that inpatient services furnished can reasonably be expected to improve the patient's condition.    Jenne Campus, MD 4/9/201811:31 AM

## 2016-07-06 NOTE — [facility] Suicide Risk Assessment (Signed)
[facility] Admission Suicide Risk Assessment   Nursing information obtained from:    patient , chart , and collateral information from wife  Demographic factors:   36 year old married male, employed  Current Mental Status:   see below Loss Factors:   decreased ability to function at work related to psychiatric symptoms  Historical Factors:   history of depression, history of prior psychotic symptoms, history of cannabis abuse  Risk Reduction Factors:   resilience, family support   Total Time spent with patient: 45 minutes Principal Problem: Major depressive disorder, recurrent episode, severe, with psychosis ([place]) Diagnosis:   Patient Active Problem List   Diagnosis Date Noted  . Major depressive disorder, recurrent episode, severe, with psychosis ([place]) [F33.3] 07/05/2016  . GAD (generalized anxiety disorder) [F41.1] 07/05/2016  . Restless leg syndrome [G25.81] 07/05/2016  . MDD (major depressive disorder), recurrent, severe, with psychosis ([place]) [F33.3] 07/05/2016   Continued Clinical Symptoms:    The "Alcohol Use Disorders Identification Test", Guidelines for Use in Primary Care, Second Edition.  World Pharmacologist [facility]). Score between 0-7:  no or low risk or alcohol related problems. Score between 8-15:  moderate risk of alcohol related problems. Score between 16-19:  high risk of alcohol related problems. Score 20 or above:  warrants further diagnostic evaluation for alcohol dependence and treatment.   CLINICAL FACTORS:  Patient is a 36 year old married male, presents due to depression and prominent psychotic symptoms. Patient is a limited historian, and presents with increased latency of speech, thought blocking and although denies hallucinations, appears internally preoccupied at times. Contributing factors may include stimulant medication, cannabis dependence.     Psychiatric Specialty Exam: Physical Exam  ROS  Blood pressure 126/74, pulse 83, temperature 97.5 F (36.4  C), temperature source Oral, resp. rate 16, height 5\' 7"  (1.702 m), weight 53.1 kg (117 lb), SpO2 100 %.Body mass index is 18.32 kg/m.   see admit note MSE   COGNITIVE FEATURES THAT CONTRIBUTE TO RISK:  Closed-mindedness and Loss of executive function    SUICIDE RISK:   Moderate:  Frequent suicidal ideation with limited intensity, and duration, some specificity in terms of plans, no associated intent, good self-control, limited dysphoria/symptomatology, some risk factors present, and identifiable protective factors, including available and accessible social support.  PLAN OF CARE: Patient will be admitted to inpatient psychiatric unit for stabilization and safety. Will provide and encourage milieu participation. Provide medication management and maked adjustments as needed.  Will follow daily.    I certify that inpatient services furnished can reasonably be expected to improve the patient's condition.   Jenne Campus, MD 07/06/2016, 4:32 PM

## 2016-07-06 NOTE — Progress Notes (Signed)
Recreation Therapy Notes  Date: 07/06/16 Time: 0930 Location: [address] Dayroom  Group Topic: Stress Management  Goal Area(s) Addresses:  Patient will verbalize importance of using healthy stress management.  Patient will identify positive emotions associated with healthy stress management.   Intervention: Stress Management  Activity :  Guided Imagery.  LRT introduced the stress management technique of guided imagery.  LRT read a script to allow patients to participate and engage in the technique.  Patients were to follow along as the script was read to engage in the activity.  Education:  Stress Management, Discharge Planning.   Education Outcome: Acknowledges edcuation/In group clarification offered/Needs additional education  Clinical Observations/Feedback: Pt did not attend group.   Victorino Sparrow, LRT/CTRS         Ria Comment, Evalee Gerard A 07/06/2016 11:02 AM

## 2016-07-06 NOTE — [facility] Group Notes (Signed)
[facility] LCSW Group Therapy  07/06/2016 1:07 PM  Type of Therapy:  Group Therapy  Participation Level:  Did Not Attend-pt invited. Chose to remain in bed.   Summary of Progress/Problems: Today's Topic: Overcoming Obstacles. Patients identified one short term goal and potential obstacles in reaching this goal. Patients processed barriers involved in overcoming these obstacles. Patients identified steps necessary for overcoming these obstacles and explored motivation (internal and external) for facing these difficulties head on.   Maheen Cwikla N Smart LCSW 07/06/2016, 1:07 PM

## 2016-07-06 NOTE — Tx Team (Signed)
Interdisciplinary Treatment and Diagnostic Plan Update  07/06/2016 Time of Session: [place] [facility] MRN: [id number]  Principal Diagnosis: Major depressive disorder, recurrent episode, severe, with psychosis ([place])  Secondary Diagnoses: Principal Problem:   Major depressive disorder, recurrent episode, severe, with psychosis ([place]) Active Problems:   GAD (generalized anxiety disorder)   Restless leg syndrome   MDD (major depressive disorder), recurrent, severe, with psychosis ([place])   Current Medications:  Current Facility-Administered Medications  Medication Dose Route Frequency Provider Last Rate Last Dose  . acetaminophen (TYLENOL) tablet 650 mg  650 mg Oral Q6H PRN Shuvon B Rankin, NP      . hydrOXYzine (ATARAX/VISTARIL) tablet 25-50 mg  25-50 mg Oral Q6H PRN Benjamine Mola, FNP      . levothyroxine (SYNTHROID, LEVOTHROID) tablet 25 mcg  25 mcg Oral QAC breakfast Shuvon B Rankin, NP   25 mcg at 07/06/16 0609  . magnesium hydroxide (MILK OF MAGNESIA) suspension 30 mL  30 mL Oral Daily PRN Shuvon B Rankin, NP      . multivitamin with minerals tablet 1 tablet  1 tablet Oral Daily Shuvon B Rankin, NP   1 tablet at 07/06/16 0752  . OLANZapine zydis (ZYPREXA) disintegrating tablet 5 mg  5 mg Oral Q8H PRN Benjamine Mola, FNP   5 mg at 07/05/16 1901  . rOPINIRole (REQUIP) tablet 1 mg  1 mg Oral QHS Shuvon B Rankin, NP   1 mg at 07/05/16 2109  . topiramate (TOPAMAX) tablet 50 mg  50 mg Oral QHS Shuvon B Rankin, NP   50 mg at 07/05/16 2109  . venlafaxine XR (EFFEXOR-XR) 24 hr capsule 150 mg  150 mg Oral Q breakfast Shuvon B Rankin, NP   150 mg at 07/06/16 0321   PTA Medications: Prescriptions Prior to Admission  Medication Sig Dispense Refill Last Dose  . B Complex Vitamins (B-COMPLEX/B-12 PO) Take 1 tablet by mouth daily.     . chlordiazePOXIDE (LIBRIUM) 5 MG capsule Take 10 mg by mouth at bedtime.     . gabapentin (NEURONTIN) 600 MG tablet Take 600 mg by mouth 4 (four) times  daily.     Marland Kitchen ibuprofen (ADVIL,MOTRIN) 200 MG tablet Take 800-1,000 mg by mouth every 6 (six) hours as needed for headache.     . levothyroxine (SYNTHROID, LEVOTHROID) 25 MCG tablet Take 25 mcg by mouth daily.  4   . Multiple Vitamin (MULTIVITAMIN WITH MINERALS) TABS tablet Take 1 tablet by mouth daily.   Unknown at Unknown time  . olanzapine-FLUoxetine (SYMBYAX) 6-25 MG per capsule Take 1 capsule by mouth every evening.   Unknown at Unknown time  . OVER THE COUNTER MEDICATION Take 1 tablet by mouth 3 (three) times daily. Hydroxycut   Unknown at Unknown time  . rOPINIRole (REQUIP) 1 MG tablet Take 1 mg by mouth at bedtime.  4 Unknown at Unknown time  . topiramate (TOPAMAX) 25 MG tablet Take 50 mg by mouth at bedtime.   Unknown at Unknown time  . venlafaxine XR (EFFEXOR-XR) 75 MG 24 hr capsule Take 75 mg by mouth 2 (two) times daily.  4 Unknown at Unknown time    Patient Stressors: Educational concerns Financial difficulties Health problems  Patient Strengths: Ability for insight Active sense of humor  Treatment Modalities: Medication Management, Group therapy, Case management,  1 to 1 session with clinician, Psychoeducation, Recreational therapy.   Physician Treatment Plan for Primary Diagnosis: Major depressive disorder, recurrent episode, severe, with psychosis ([place]) Long Term Goal(s):  Short Term Goals:    Medication Management: Evaluate patient's response, side effects, and tolerance of medication regimen.  Therapeutic Interventions: 1 to 1 sessions, Unit Group sessions and Medication administration.  Evaluation of Outcomes: Progressing  Physician Treatment Plan for Secondary Diagnosis: Principal Problem:   Major depressive disorder, recurrent episode, severe, with psychosis ([place]) Active Problems:   GAD (generalized anxiety disorder)   Restless leg syndrome   MDD (major depressive disorder), recurrent, severe, with psychosis ([place])  Long Term Goal(s):     Short Term  Goals:       Medication Management: Evaluate patient's response, side effects, and tolerance of medication regimen.  Therapeutic Interventions: 1 to 1 sessions, Unit Group sessions and Medication administration.  Evaluation of Outcomes: Progressing   RN Treatment Plan for Primary Diagnosis: Major depressive disorder, recurrent episode, severe, with psychosis ([place]) Long Term Goal(s): Knowledge of disease and therapeutic regimen to maintain health will improve  Short Term Goals: Ability to remain free from injury will improve, Ability to verbalize feelings will improve and Ability to disclose and discuss suicidal ideas  Medication Management: RN will administer medications as ordered by provider, will assess and evaluate patient's response and provide education to patient for prescribed medication. RN will report any adverse and/or side effects to prescribing provider.  Therapeutic Interventions: 1 on 1 counseling sessions, Psychoeducation, Medication administration, Evaluate responses to treatment, Monitor vital signs and CBGs as ordered, Perform/monitor CIWA, COWS, AIMS and Fall Risk screenings as ordered, Perform wound care treatments as ordered.  Evaluation of Outcomes: Progressing   LCSW Treatment Plan for Primary Diagnosis: Major depressive disorder, recurrent episode, severe, with psychosis ([place]) Long Term Goal(s): Safe transition to appropriate next level of care at discharge, Engage patient in therapeutic group addressing interpersonal concerns.  Short Term Goals: Engage patient in aftercare planning with referrals and resources, Facilitate patient progression through stages of change regarding substance use diagnoses and concerns and Identify triggers associated with mental health/substance abuse issues  Therapeutic Interventions: Assess for all discharge needs, 1 to 1 time with Social worker, Explore available resources and support systems, Assess for adequacy in community support  network, Educate family and significant other(s) on suicide prevention, Complete Psychosocial Assessment, Interpersonal group therapy.  Evaluation of Outcomes: Progressing   Progress in Treatment: Attending groups: No. New to unit. Continuing to assess.  Participating in groups: No. Taking medication as prescribed: Yes. Toleration medication: Yes. Family/Significant other contact made: No, will contact:  family member if patient consents Patient understands diagnosis: Yes. Discussing patient identified problems/goals with staff: Yes. Medical problems stabilized or resolved: Yes. Denies suicidal/homicidal ideation: Yes. Issues/concerns per patient self-inventory: No. Other: n/a   New problem(s) identified: Yes, pt appears to be thought blocking; possibly responding to internal stimuli.   New Short Term/Long Term Goal(s): medication stabilization; development of comprehensive mental wellness plan.   Discharge Plan or Barriers: Pt sees Dr. Toy Care who encouraged pt to be evaluated for ability to return to work.   Reason for Continuation of Hospitalization: Depression Medication stabilization  Anxiety  Estimated Length of Stay: 3-5 days   Attendees: Patient: 07/06/2016 8:15 AM  Physician: Dr. Parke Poisson MD 07/06/2016 8:15 AM  Nursing: Sharl Ma; Christa RN 07/06/2016 8:15 AM  RN Care Manager: Lars Pinks CM 07/06/2016 8:15 AM  Social Worker: Maxie Better, LCSW 07/06/2016 8:15 AM  Recreational Therapist: Rhunette Croft 07/06/2016 8:15 AM  Other: May Augustin NP 07/06/2016 8:15 AM  Other:  07/06/2016 8:15 AM  Other: 07/06/2016 8:15 AM    Scribe for Treatment  Team: [place], LCSW 07/06/2016 8:15 AM

## 2016-07-07 DIAGNOSIS — F19950 Other psychoactive substance use, unspecified with psychoactive substance-induced psychotic disorder with delusions: Principal | ICD-10-CM

## 2016-07-07 DIAGNOSIS — F17210 Nicotine dependence, cigarettes, uncomplicated: Secondary | ICD-10-CM

## 2016-07-07 LAB — LIPID PANEL
Cholesterol: 136 mg/dL (ref 0–200)
HDL: 65 mg/dL (ref >40)
LDL CALC: 55 mg/dL (ref 0–99)
Total CHOL/HDL Ratio: 2.1 RATIO
Triglycerides: 82 mg/dL (ref <150)
VLDL: 16 mg/dL (ref 0–40)

## 2016-07-07 LAB — TSH: TSH: 1.334 u[IU]/mL (ref 0.350–4.500)

## 2016-07-07 MED ORDER — OLANZAPINE 10 MG PO TBDP
15.0000 mg | ORAL_TABLET | Freq: Every day | ORAL | Status: DC
  Administered 2016-07-07 – 2016-07-08 (×2): 15 mg via ORAL
  Filled 2016-07-07 (×5): qty 1

## 2016-07-07 NOTE — Plan of Care (Signed)
Problem: Health Behavior/Discharge Planning: Goal: Compliance with prescribed medication regimen will improve Outcome: Progressing Pt was compliant with HS medications tonight.

## 2016-07-07 NOTE — Progress Notes (Signed)
D: Patient has been isolative to room, in bed. Spoke with patient 1:1. Rating depression, hopelessness and anxiety all at a 2/10. Rates sleep as good, appetite as good, energy as low and concentration as poor. Patient's affect suspicious, with congruent mood. Displays thought blocking and on assessment, it is unclear as to whether he is experiencing internal stimuli. (Denies AVH.) States goal for today is to "get my meds situated so I can go home. Hopefully meet with a doctor." Denies pain, physical problems.   A: Medicated per orders, no prns given or requested. Emotional support offered and self inventory reviewed. Discussed POC with MD, SW.  Encouraged to participate in programming.  R: Patient verbalizes understanding of POC though remains isolative. Patient denies SI/HI and remains safe on level III obs.

## 2016-07-07 NOTE — Progress Notes (Signed)
D: Pt was in the hallway upon initial approach.  Pt presents with anxious affect and mood.  He appears suspicious while talking to staff.  He has not been interacting with his peers.  He expressed that he felt uncomfortable because he now has a roommate.  His goal today was to "speak to the doctor."  He reports he had a good visit with his wife and mother tonight.  Pt denies SI/HI, denies hallucinations, denies pain.    A: Introduced self to pt.  Actively listened to pt and offered support and encouragement. Medications administered per order.  No roommate order obtained due to pt's paranoia.  Q15 minute safety checks maintained.  R: Pt is safe on the unit.  Pt is compliant with medications.  Pt verbally contracts for safety.  Will continue to monitor and assess.

## 2016-07-07 NOTE — Progress Notes (Addendum)
Nursing Progress Note: 7p-7a D: Pt currently presents with a empty/flat affect and behavior. Pt states "I am not anxious right now suprisingly. Group was actually really helpful. It really calmed me down" Interacting appropriately with milieu. Pt reports ok sleep with current medication regimen.   A: Pt provided with medications per providers orders. Pt's labs and vitals were monitored throughout the night. Pt supported emotionally and encouraged to express concerns and questions. Pt educated on medications.  R: Pt's safety ensured with 15 minute and environmental checks. Pt currently denies SI/HI/Self Harm and AVH. Pt verbally contracts to seek staff if SI/HI or A/VH occurs and to consult with staff before acting on any harmful thoughts. Will continue to monitor.

## 2016-07-07 NOTE — Progress Notes (Signed)
[facility] MD Progress Note  07/07/2016 11:17 AM Zachary Dean  MRN:  [id number] Subjective:  36 yo Caucasian male, married, employed. Background history of Depression, anxiety and SUD. Patient referred in for admission by his psychiatrist. His wife reports a week history of bizarre behavior. He is stated to have expressed belief that their house and Internet has been hacked. He has been expressing abnormal perceptions. He has become guarded and easily agitated. UDS was positive for amphetamines, THC and benzodiazepines.   Nursing staff reports that he is odd and internally stimulated. He exhibits thought blocking. He has not been able to fully participate with unit groups and activities. No aggressive outburst so far.   At interview, patient reports being prescribed 90 mg of Addrerrall daily. Says he was being treated for adult ADHD. Says he has been doing a lot of "soul searching ,,, it's like some coincident events that has certain meaning ,,,, have you heard of Godwings ,,,, it is starnge but between my soul and God". Patient notes that he had not been able to function at work. Says his employers requested that he takes time off to seek treatment. Says he had not been able to sleep at home. He was tense and anxious most of the time. Says he has been able to sleep here. Patient denies any hallucinations since he has been here. Says he does not have any suicidal or homicidal thoughts. Notes that his mind is still sluggish and he feels he does not make sense. We discussed his current medications. He suggested Topiramate to his psychiatrist after he read about it on the Internet. We agreed to wean him of this as it can cause cognitive dulling.  Principal Problem: Substance Induced Psychotic Disorder Diagnosis:   Patient Active Problem List   Diagnosis Date Noted  . Major depressive disorder, recurrent episode, severe, with psychosis ([place]) [F33.3] 07/05/2016  . GAD (generalized anxiety disorder)  [F41.1] 07/05/2016  . Restless leg syndrome [G25.81] 07/05/2016  . MDD (major depressive disorder), recurrent, severe, with psychosis ([place]) [F33.3] 07/05/2016   Total Time spent with patient: 20 minutes  Past Psychiatric History: As in H&P  Past Medical History:  Past Medical History:  Diagnosis Date  . Anxiety   . Depression   . Hypothyroid   . Thyroid disease     Past Surgical History:  Procedure Laterality Date  . TONSILLECTOMY     Family History:  Family History  Problem Relation Age of Onset  . Cancer Father   . Diabetes Father    Family Psychiatric  History: As in H&P Social History:  History  Alcohol use Not on file     History  Drug use: Unknown    Social History   Social History  . Marital status: Married    Spouse name: N/A  . Number of children: N/A  . Years of education: N/A   Social History Main Topics  . Smoking status: Current Every Day Smoker    Packs/day: 2.00    Years: 5.00  . Smokeless tobacco: Never Used  . Alcohol use None  . Drug use: Unknown  . Sexual activity: Yes    Birth control/ protection: None   Other Topics Concern  . None   Social History Narrative  . None   Additional Social History:    Pain Medications: none History of alcohol / drug use?: No history of alcohol / drug abuse Negative Consequences of Use: Personal relationships, Work / School Withdrawal Symptoms: Aggressive/Assaultive  Sleep: Better since he has been here  Appetite:  Fair  Current Medications: Current Facility-Administered Medications  Medication Dose Route Frequency Provider Last Rate Last Dose  . acetaminophen (TYLENOL) tablet 650 mg  650 mg Oral Q6H PRN Shuvon B Rankin, NP      . gabapentin (NEURONTIN) capsule 300 mg  300 mg Oral TID Jenne Campus, MD   300 mg at 07/07/16 3154  . hydrOXYzine (ATARAX/VISTARIL) tablet 25-50 mg  25-50 mg Oral Q6H PRN Benjamine Mola, FNP      . levothyroxine (SYNTHROID, LEVOTHROID) tablet 25 mcg  25  mcg Oral QAC breakfast Shuvon B Rankin, NP   25 mcg at 07/07/16 0620  . magnesium hydroxide (MILK OF MAGNESIA) suspension 30 mL  30 mL Oral Daily PRN Shuvon B Rankin, NP   30 mL at 07/07/16 0086  . multivitamin with minerals tablet 1 tablet  1 tablet Oral Daily Shuvon B Rankin, NP   1 tablet at 07/07/16 0832  . nicotine (NICODERM CQ - dosed in mg/24 hours) patch 21 mg  21 mg Transdermal Daily Jenne Campus, MD   21 mg at 07/07/16 0834  . OLANZapine zydis (ZYPREXA) disintegrating tablet 5 mg  5 mg Oral BID Jenne Campus, MD   5 mg at 07/07/16 7619  . rOPINIRole (REQUIP) tablet 1 mg  1 mg Oral QHS Shuvon B Rankin, NP   1 mg at 07/06/16 2132  . topiramate (TOPAMAX) tablet 25 mg  25 mg Oral QHS Jenne Campus, MD   25 mg at 07/06/16 2132  . venlafaxine XR (EFFEXOR-XR) 24 hr capsule 150 mg  150 mg Oral Q breakfast Shuvon B Rankin, NP   150 mg at 07/07/16 5093    Lab Results:  Results for orders placed or performed during the hospital encounter of 07/05/16 (from the past 48 hour(s))  TSH     Status: None   Collection Time: 07/07/16  6:10 AM  Result Value Ref Range   TSH 1.334 0.350 - 4.500 uIU/mL    Comment: Performed by a 3rd Generation assay with a functional sensitivity of <=0.01 uIU/mL. Performed at [facility], [place] [address], [place], [place] [address]   Lipid panel     Status: None   Collection Time: 07/07/16  6:10 AM  Result Value Ref Range   Cholesterol 136 0 - 200 mg/dL   Triglycerides 82 <150 mg/dL   HDL 65 >40 mg/dL   Total CHOL/HDL Ratio 2.1 RATIO   VLDL 16 0 - 40 mg/dL   LDL Cholesterol 55 0 - 99 mg/dL    Comment:        Total Cholesterol/HDL:CHD Risk Coronary Heart Disease Risk Table                     Men   Women  1/2 Average Risk   3.4   3.3  Average Risk       5.0   4.4  2 X Average Risk   9.6   7.1  3 X Average Risk  23.4   11.0        Use the calculated Patient Ratio above and the CHD Risk Table to determine the patient's CHD Risk.         ATP Dean CLASSIFICATION (LDL):  <100     mg/dL   Optimal  100-129  mg/dL   Near or Above  Optimal  130-159  mg/dL   Borderline  160-189  mg/dL   High  >190     mg/dL   Very High Performed at [facility] [address] [place], [place] [address]     Blood Alcohol level:  Lab Results  Component Value Date   ETH <5 77/93/9030    Metabolic Disorder Labs: No results found for: HGBA1C, MPG No results found for: PROLACTIN Lab Results  Component Value Date   CHOL 136 07/07/2016   TRIG 82 07/07/2016   HDL 65 07/07/2016   CHOLHDL 2.1 07/07/2016   VLDL 16 07/07/2016   LDLCALC 55 07/07/2016    Physical Findings: AIMS: Facial and Oral Movements Muscles of Facial Expression: None, normal Lips and Perioral Area: None, normal Jaw: None, normal Tongue: None, normal,Extremity Movements Upper (arms, wrists, hands, fingers): None, normal Lower (legs, knees, ankles, toes): None, normal, Trunk Movements Neck, shoulders, hips: None, normal, Overall Severity Severity of abnormal movements (highest score from questions above): None, normal Incapacitation due to abnormal movements: None, normal Patient's awareness of abnormal movements (rate only patient's report): No Awareness, Dental Status Current problems with teeth and/or dentures?: No Does patient usually wear dentures?: No  CIWA:  CIWA-Ar Total: 6 COWS:  COWS Total Score: 2  Musculoskeletal: Strength & Muscle Tone: within normal limits Gait & Station: normal Patient leans: N/A  Psychiatric Specialty Exam: Physical Exam  Constitutional: He is oriented to person, place, and time. He appears well-developed and well-nourished.  HENT:  Head: Normocephalic and atraumatic.  Eyes: Conjunctivae and EOM are normal. Pupils are equal, round, and reactive to light.  Neck: Normal range of motion. Neck supple.  Cardiovascular: Normal rate, regular rhythm and normal heart sounds.   Respiratory: Effort normal  and breath sounds normal.  GI: Soft. Bowel sounds are normal.  Musculoskeletal: Normal range of motion.  Neurological: He is alert and oriented to person, place, and time. He has normal reflexes.  Skin: Skin is warm and dry.  Psychiatric:  As above    ROS  Blood pressure 127/82, pulse 71, temperature 97.3 F (36.3 C), temperature source Oral, resp. rate 16, height 5\' 7"  (1.702 m), weight 53.1 kg (117 lb), SpO2 100 %.Body mass index is 18.32 kg/m.  General Appearance: Slim build, in bed, poor personal hygiene. Perplexed and internally distracted at times. Marked psychomotor retardation. Odd mannerisms.   Eye Contact:  Minimal  Speech:  Slow and has marked latency  Volume:  Decreased  Mood:  Overwhelmed by his subjective experience.   Affect:  Odd and has a psychotic quality.   Thought Process:  Linear but has periods of thought blocking  Orientation:  Full (Time, Place, and Person)  Thought Content:  Illogical and Paranoid Ideation. No preoccupation with violent thought. Auditory hallucinations  Suicidal Thoughts:  No  Homicidal Thoughts:  No  Memory:  Immediate;   Poor Recent;   Poor Remote;   Poor  Judgement:  Poor  Insight:  Partial  Psychomotor Activity:  Decreased  Concentration:  Concentration: Poor and Attention Span: Poor  Recall:  Poor  Fund of Knowledge:  Fair  Language:  Fair  Akathisia:  No  Handed:    AIMS (if indicated):     Assets:  Housing Intimacy Physical Health Vocational/Educational  ADL's:  Impaired  Cognition:  WNL  Sleep:  Number of Hours: 6.75     Treatment Plan Summary:  Patient is psychotic. This is likely related to effects of stimulants and THC. Psychomotor retardation  could be partly related to Topiramate. We discussed medication adjustment as below. Patient consented to treatment.    Psychiatric: Substance Induced Psychotic Disorder SUD MDD recurrent  Medical: Hypothyroidism  Psychosocial:  Inability to function at  work Inability to function at home.   PLAN: 1. Discontinue Topiramate 2. Olanzapine 15 at bedtime only 3. Discontinue Ropinrole as restlessness is related to effects of stimulant medications.  4. Encourage unit groups and activities 5. Continue to monitor mood, behavior and interaction with peers 6. Collateral from his family   [place], MD 07/07/2016, 11:17 AM

## 2016-07-07 NOTE — Progress Notes (Signed)
Recreation Therapy Notes  Animal-Assisted Activity (AAA) Program Checklist/Progress Notes Patient Eligibility Criteria Checklist & Daily Group note for Rec TxIntervention  Date: 04.10.2018 Time: 2:45pm Location: [address]   AAA/T Program Assumption of Risk Form signed by Patient/ or Parent Legal Guardian Yes  Patient is free of allergies or sever asthma Yes  Patient reports no fear of animals Yes  Patient reports no history of cruelty to animals Yes  Patient understands his/her participation is voluntary Yes  Patient washes hands before animal contact Yes  Patient washes hands after animal contact Yes  Behavioral Response: Did not attend.   Zachary Dean, LRT/CTRS       Zachary Dean 07/07/2016 3:06 PM

## 2016-07-07 NOTE — BHH Counselor (Signed)
Adult Comprehensive Assessment  Patient ID: Zachary Dean, male   DOB: 28-Feb-1981, 36 y.o.   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 close to mother; father deceased Museum/gallery curator / Lack of resources (include bankruptcy): income from his job and wife's job as Therapist, nutritional / Lack of housing: lives in home with his wife in [place], [place] Physical health (include injuries & life threatening diseases): none identified Social relationships: pt reports having few friends Substance abuse: marijuana daily for several months. Pt denies any other drug or alcohol use Bereavement / Loss: none identified by pt.   Living/Environment/Situation:  Living Arrangements: Spouse/significant other Living conditions (as described by patient or guardian): lives in home with his wife How long has patient lived in current situation?: 7 years  What is atmosphere in current home: Comfortable, Quarry manager, Supportive  Family History:  Marital status: Married Number of Years Married: 7 What types of issues is patient dealing with in the relationship?: pt did not identify any issues Additional relationship information: per wife (taken from assessment note), pt has been increasingly paranoid and bizarre over past week.  Are you sexually active?: Yes What is your sexual orientation?: heterosexual Has your sexual activity been affected by drugs, alcohol, medication, or emotional stress?: n/a  Does patient have children?: No  Childhood History:  By whom was/is the patient raised?: Both parents Additional childhood history information: "good childhood." "I don't think they had any substance abuse problems." Patient states that his mother suffers from depression and anxiety. "She and I are very  much alike."  Description of patient's relationship with caregiver when they were a child: close to both parents Patient's description of current relationship with people who raised him/her: father deceased. "I wish I could see my mother more but we are still very close."  How were you disciplined when you got in trouble as a child/adolescent?: n/a  Does patient have siblings?: Yes Number of Siblings: 1 Description of patient's current relationship with siblings: "I have a little sister. 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 How long has patient been employed?: 6 years  Patient's job has been impacted by current illness: Yes Describe how patient's job has been impacted: "My HR rep wanted me to be evaluated for work fatigue."  What is the longest time patient has a held a job?: current job Where was the patient employed at that time?: current job.  Has patient ever been in the [employer]?: No Has patient ever served in combat?: No Did You Receive Any Psychiatric Treatment/Services While in the [employer]?: No Are There Guns or Other Weapons in [place]?: No Are These [place]?:  (no weapons)  Museum/gallery curator Resources:   Financial resources: Income  from employment, Income from spouse, Private insurance Does patient have  a representative payee or guardian?: No  Alcohol/Substance Abuse:   What has been your use of drugs/alcohol within the last 12 months?: reports daily marijuana abuse for several months. "It helps me detach from my frustrations when I get home from work." Pt denies alcohol or any other drug use.  If attempted suicide, did drugs/alcohol play a role in this?: No Alcohol/Substance Abuse Treatment Hx: Past Tx, Inpatient, Past Tx, Outpatient If yes, describe treatment: [address] in 2006. pt is currently a patient of Dr. Toy Care ([facility]) for medication management. "I think I'm diagnosed with depression."  Has alcohol/substance abuse ever caused legal problems?: No  Social Support System:   Patient's Community Support System: Poor Describe Community Support System: pt denies having supports outside of wife and mother.  Type of faith/religion: did not answer How does patient's faith help to cope with current illness?: n/a   Leisure/Recreation:   Leisure and Hobbies: Pt did not answer  Strengths/Needs:   What things does the patient do well?: pt did not answer-appears to be thought blocking In what areas does patient struggle / problems for patient: paranoia; "I have trouble finding the words I need to describe what I'm trying to say."   Discharge Plan:   Does patient have access to transportation?: Yes (pt has car and license) Will patient be returning to same living situation after discharge?: Yes ("home with my wife--hopefully soon." ) Currently receiving community mental health services: Yes (From [facility]om) ([facility]) If no, would patient like referral for services when discharged?: No ([place]) Does patient have financial barriers related to discharge medications?: No (private insurance and income.)  Summary/Recommendations:   Architectural technologist and Recommendations (to be completed by the evaluator): Patient is 36 yo male living in [place], [place] ([place]) with his wife. Patient presents to the hospital due to increased paranoia, delusions, and though blocking. Patient reports marijuana use daily "for several months" and states that his HR rep at work suggested that he get evaluated for "work fatigue." Patient is currently seeing Dr. Toy Care for medication management/psychiatry. He has a diagnosis of MDD with psychosis. Patient is married, no children, and employed. He denies SI/HI/AVH currently. Recommendations for patient include: crisis stabilization, therapeutic milieu, encourage group attendance and participation, medication managment for mood stabilization/elmination of psychosis, and development of comprehensive mental wellness plan. Patient plans to return home; follow-up with Dr. Toy Care and gave permission to CSW to make follow-up appt.   Kimber Relic Smart LCSW 07/07/2016 11:17 AM

## 2016-07-07 NOTE — BHH Group Notes (Signed)
[place] [facility] Group Notes:  (Nursing/MHT/Case Management/Adjunct)  Date:  07/07/2016  Time:  0900  Type of Therapy:  Nurse Education - Goal Setting for Recovery via the SMART Method  Participation Level:  Did Not Attend  Participation Quality:    Affect:    Cognitive:    Insight:    Engagement in Group:    Modes of Intervention:    Summary of Progress/Problems: Patient was invited to attend however elected to remain in bed.  Loletta Specter [facility] 07/07/2016, 0930

## 2016-07-07 NOTE — BHH Group Notes (Signed)
[place] LCSW Group Therapy  07/07/2016 3:12 PM  Type of Therapy:  Group Therapy  Participation Level:  Did Not Attend-pt invited. Chose to remain in bed.   Summary of Progress/Problems: MHA Speaker came to talk about his personal journey with substance abuse and addiction. The pt processed ways by which to relate to the speaker. [place] speaker provided handouts and educational information pertaining to groups and services offered by the [facility].   Torryn Fiske N Smart LCSW 07/07/2016, 3:12 PM

## 2016-07-07 NOTE — Progress Notes (Signed)
Day shift nurse and night shift nurse discussed treatment with patient's wife and mother. [name] states, "I'm paying probably tens of thousands of dollars for treatment for my husband, and he hasn't started any new medications. His anxiety keeps getting worse. He can't take vistaril because he gets restless leg syndrome and that makes his anxiety even worse. We are just really worried that all this time and money is going to be wasted on a dead end."

## 2016-07-08 LAB — HEMOGLOBIN A1C
Hgb A1c MFr Bld: 5.1 % (ref 4.8–5.6)
Mean Plasma Glucose: 100 mg/dL

## 2016-07-08 LAB — PROLACTIN: PROLACTIN: 31.6 ng/mL — AB (ref 4.0–15.2)

## 2016-07-08 MED ORDER — OLANZAPINE 5 MG PO TBDP
ORAL_TABLET | ORAL | Status: AC
  Administered 2016-07-08: 15:00:00
  Filled 2016-07-08: qty 1

## 2016-07-08 MED ORDER — OLANZAPINE 5 MG PO TBDP: 5.0000 mg | ORAL_TABLET | Freq: Once | ORAL | Status: AC

## 2016-07-08 MED ORDER — GABAPENTIN 300 MG PO CAPS
300.0000 mg | ORAL_CAPSULE | Freq: Four times a day (QID) | ORAL | Status: DC | PRN
  Administered 2016-07-09 – 2016-07-10 (×3): 300 mg via ORAL
  Filled 2016-07-08: qty 3

## 2016-07-08 NOTE — BHH Group Notes (Signed)
[place] LCSW Group Therapy  07/08/2016 10:51 AM  Type of Therapy:  Group Therapy  Participation Level:  Did Not Attend-pt chose to remain in bed.   Summary of Progress/Problems: Emotion Regulation: This group focused on both positive and negative emotion identification and allowed group members to process ways to identify feelings, regulate negative emotions, and find healthy ways to manage internal/external emotions. Group members were asked to reflect on a time when their reaction to an emotion led to a negative outcome and explored how alternative responses using emotion regulation would have benefited them. Group members were also asked to discuss a time when emotion regulation was utilized when a negative emotion was experienced.   Mac Dowdell N Smart LCSW 07/08/2016, 10:51 AM

## 2016-07-08 NOTE — Progress Notes (Signed)
Nursing Progress Note: 7p-7a D: Pt currently presents with a flat affect and behavior. Pt states "I think I had a panic attack earlier, but I feel fine now. Arguing triggers me. My parents argued a lot when I was a kid, and it gives me an anxiety overload." Interacting minimally with milieu. Pt reports good sleep with current medication regimen.   A: Pt provided with medications per providers orders. Pt's labs and vitals were monitored throughout the night. Pt supported emotionally and encouraged to express concerns and questions. Pt educated on medications.  R: Pt's safety ensured with 15 minute and environmental checks. Pt currently denies SI/HI/Self Harm and AVH. Pt verbally contracts to seek staff if SI/HI or A/VH occurs and to consult with staff before acting on any harmful thoughts. Will continue to monitor.

## 2016-07-08 NOTE — Progress Notes (Signed)
Patient came to nurses station at [facility], tearful, agitated and very anxious. Patient stating, "I have to leave. I can't take the arguing. You're going to keep me here forever. I can't pass the test. I can't stand the tv and being in there (dayroom) and I can't listen to the arguing." Patient denies this is a hallucination and most likely is male peer arguing with visitor down the hall. Patient offered emotional support and reassurance and encouraged to go to room where stimuli is decreased. Dr. Modesta Messing called as patient cannot take only available prn on chart, vistaril, due to hx of restless leg reaction. Awaiting call back. Patient informed staff is has contacted doctor to assist with his current symptoms. Patient crying, in bathroom and verbalizes understanding. Report given to [facility], Therapist, sports.

## 2016-07-08 NOTE — Progress Notes (Signed)
[facility] MD Progress Note  07/08/2016 2:57 PM Zachary Dean  MRN:  [id number] Subjective:  36 yo Caucasian male, married, employed. Background history of Depression, anxiety and SUD. Patient referred in for admission by his psychiatrist. His wife reports a week history of bizarre behavior. He is stated to have expressed belief that their house and Internet has been hacked. He has been expressing abnormal perceptions. He has become guarded and easily agitated. UDS was positive for amphetamines, THC and benzodiazepines.   Seen today. Says he started feeling anxious after launch. Not sure what precipitated this. Says he had caffeine after his meals. Not expressing any paranoia. Not having any form of hallucination. Anxiety is associated with elevation in his blood pressure. Patient states that he slept well last night. Notes that his mind has been less foggy today. No suicidal thoughts. No thoughts of violence. No homicidal thoughts.   Nursing staff reports that he has been tolerating medication adjustments well. He attended groups last night. He has not attended any group today. He is still reporting anxiety. He has physiological evidence of anxiety. He has not expressed any suicidal or homicidal thoughts.   Principal Problem: Substance Induced Psychotic Disorder Diagnosis:   Patient Active Problem List   Diagnosis Date Noted  . 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 GAD (generalized anxiety disorder) [F41.1] 07/05/2016  . Restless leg syndrome [G25.81] 07/05/2016  . MDD (major depressive disorder), recurrent, severe, with psychosis ([place]) [F33.3] 07/05/2016   Total Time spent with patient: 20 minutes  Past Psychiatric History: As in H&P  Past Medical History:  Past Medical History:  Diagnosis Date  . Anxiety   . Depression   . Hypothyroid   . Thyroid disease     Past Surgical History:  Procedure Laterality Date  . TONSILLECTOMY      Family History:  Family History  Problem Relation Age of Onset  . Cancer Father   . Diabetes Father    Family Psychiatric  History: As in H&P Social History:  History  Alcohol use Not on file     History  Drug use: Unknown    Social History   Social History  . Marital status: Married    Spouse name: N/A  . Number of children: N/A  . Years of education: N/A   Social History Main Topics  . Smoking status: Current Every Day Smoker    Packs/day: 2.00    Years: 5.00  . Smokeless tobacco: Never Used  . Alcohol use None  . Drug use: Unknown  . Sexual activity: Yes    Birth control/ protection: None   Other Topics Concern  . None   Social History Narrative  . None   Additional Social History:    Pain Medications: none History of alcohol / drug use?: No history of alcohol / drug abuse Negative Consequences of Use: Personal relationships, Work / School Withdrawal Symptoms: Aggressive/Assaultive         Sleep: Good  Appetite:  Good  Current Medications: Current Facility-Administered Medications  Medication Dose Route Frequency Provider Last Rate Last Dose  . acetaminophen (TYLENOL) tablet 650 mg  650 mg Oral Q6H PRN Shuvon B Rankin, NP      . gabapentin (NEURONTIN) capsule 300 mg  300 mg Oral TID Jenne Campus, MD   300 mg at 07/08/16 1155  . hydrOXYzine (ATARAX/VISTARIL) tablet 25-50 mg  25-50 mg Oral Q6H PRN Benjamine Mola, FNP      .  levothyroxine (SYNTHROID, LEVOTHROID) tablet 25 mcg  25 mcg Oral QAC breakfast Shuvon B Rankin, NP   25 mcg at 07/08/16 0616  . magnesium hydroxide (MILK OF MAGNESIA) suspension 30 mL  30 mL Oral Daily PRN Shuvon B Rankin, NP   30 mL at 07/07/16 6160  . multivitamin with minerals tablet 1 tablet  1 tablet Oral Daily Shuvon B Rankin, NP   1 tablet at 07/08/16 0802  . nicotine (NICODERM CQ - dosed in mg/24 hours) patch 21 mg  21 mg Transdermal Daily Jenne Campus, MD   21 mg at 07/08/16 0802  . OLANZapine zydis (ZYPREXA)  disintegrating tablet 15 mg  15 mg Oral QHS Artist Beach, MD   15 mg at 07/07/16 2125  . OLANZapine zydis (ZYPREXA) disintegrating tablet 5 mg  5 mg Oral Once Artist Beach, MD      . venlafaxine XR (EFFEXOR-XR) 24 hr capsule 150 mg  150 mg Oral Q breakfast Shuvon B Rankin, NP   150 mg at 07/08/16 0802    Lab Results:  Results for orders placed or performed during the hospital encounter of 07/05/16 (from the past 48 hour(s))  TSH     Status: None   Collection Time: 07/07/16  6:10 AM  Result Value Ref Range   TSH 1.334 0.350 - 4.500 uIU/mL    Comment: Performed by a 3rd Generation assay with a functional sensitivity of <=0.01 uIU/mL. Performed at [facility], [place] [address], [place], [place] [address]   Lipid panel     Status: None   Collection Time: 07/07/16  6:10 AM  Result Value Ref Range   Cholesterol 136 0 - 200 mg/dL   Triglycerides 82 <150 mg/dL   HDL 65 >40 mg/dL   Total CHOL/HDL Ratio 2.1 RATIO   VLDL 16 0 - 40 mg/dL   LDL Cholesterol 55 0 - 99 mg/dL    Comment:        Total Cholesterol/HDL:CHD Risk Coronary Heart Disease Risk Table                     Men   Women  1/2 Average Risk   3.4   3.3  Average Risk       5.0   4.4  2 X Average Risk   9.6   7.1  3 X Average Risk  23.4   11.0        Use the calculated Patient Ratio above and the CHD Risk Table to determine the patient's CHD Risk.        ATP Dean CLASSIFICATION (LDL):  <100     mg/dL   Optimal  100-129  mg/dL   Near or Above                    Optimal  130-159  mg/dL   Borderline  160-189  mg/dL   High  >190     mg/dL   Very High Performed at [facility] [address], [place], [place] [address]   Prolactin     Status: Abnormal   Collection Time: 07/07/16  6:10 AM  Result Value Ref Range   Prolactin 31.6 (H) 4.0 - 15.2 ng/mL    Comment: (NOTE) Performed At: [facility] [place], [place] [id number] Lindon Romp MD  KK:[id number] Performed at [facility], [place] [address] [place], [place] [address]   Hemoglobin A1c  Status: None   Collection Time: 07/07/16  6:10 AM  Result Value Ref Range   Hgb A1c MFr Bld 5.1 4.8 - 5.6 %    Comment: (NOTE)         Pre-diabetes: 5.7 - 6.4         Diabetes: >6.4         Glycemic control for adults with diabetes: <7.0    Mean Plasma Glucose 100 mg/dL    Comment: (NOTE) Performed At: [facility] [place], [place] [id number] Lindon Romp MD [id number] Performed at [facility], [place] [address], [place], [place] [address]     Blood Alcohol level:  Lab Results  Component Value Date   ETH <5 50/35/4656    Metabolic Disorder Labs: Lab Results  Component Value Date   HGBA1C 5.1 07/07/2016   MPG 100 07/07/2016   Lab Results  Component Value Date   PROLACTIN 31.6 (H) 07/07/2016   Lab Results  Component Value Date   CHOL 136 07/07/2016   TRIG 82 07/07/2016   HDL 65 07/07/2016   CHOLHDL 2.1 07/07/2016   VLDL 16 07/07/2016   LDLCALC 55 07/07/2016    Physical Findings: AIMS: Facial and Oral Movements Muscles of Facial Expression: None, normal Lips and Perioral Area: None, normal Jaw: None, normal Tongue: None, normal,Extremity Movements Upper (arms, wrists, hands, fingers): None, normal Lower (legs, knees, ankles, toes): None, normal, Trunk Movements Neck, shoulders, hips: None, normal, Overall Severity Severity of abnormal movements (highest score from questions above): None, normal Incapacitation due to abnormal movements: None, normal Patient's awareness of abnormal movements (rate only patient's report): No Awareness, Dental Status Current problems with teeth and/or dentures?: No Does patient usually wear dentures?: No  CIWA:  CIWA-Ar Total: 6 COWS:  COWS Total Score: 2  Musculoskeletal: Strength & Muscle Tone: within normal limits Gait & Station: normal Patient leans:  N/A  Psychiatric Specialty Exam: Physical Exam  Constitutional: He is oriented to person, place, and time. He appears well-developed and well-nourished.  HENT:  Head: Normocephalic and atraumatic.  Eyes: Conjunctivae and EOM are normal. Pupils are equal, round, and reactive to light.  Neck: Normal range of motion. Neck supple.  Cardiovascular: Normal rate, regular rhythm and normal heart sounds.   Respiratory: Effort normal and breath sounds normal.  GI: Soft. Bowel sounds are normal.  Musculoskeletal: Normal range of motion.  Neurological: He is alert and oriented to person, place, and time. He has normal reflexes.  Skin: Skin is warm and dry.  Psychiatric:  As above    ROS  Blood pressure (!) 152/96, pulse (!) 110, temperature 98.6 F (37 C), temperature source Oral, resp. rate 17, height 5\' 7"  (1.702 m), weight 53.1 kg (117 lb), SpO2 100 %.Body mass index is 18.32 kg/m.  General Appearance: Casually dressed, more spontaneous today. Perplexed.   Eye Contact:  Better  Speech:  Spontaneous. Soft spoken  Volume:  Decreased  Mood: Feel anxious   Affect:  Odd and has a psychotic quality.   Thought Process:  Linear and goal directed  Orientation:  Full (Time, Place, and Person)  Thought Content:  No delusional theme. No preoccupation with violent thoughts. No negative ruminations. No obsession.  No hallucination in any modality.   Suicidal Thoughts:  No  Homicidal Thoughts:  No  Memory:  Immediate;   Poor Recent;   Poor Remote;   Poor  Judgement:  Poor  Insight:  Partial  Psychomotor Activity:  Decreased  Concentration:  Concentration: Poor and Attention Span: Poor  Recall:  Poor  Fund of Knowledge:  Fair  Language:  Fair  Akathisia:  No  Handed:    AIMS (if indicated):     Assets:  Housing Intimacy Physical Health Vocational/Educational  ADL's:  Impaired  Cognition:  WNL  Sleep:  Number of Hours: 6.75     Treatment Plan Summary:  Patient is still psychotic.  Anxiety could be related to his subjective psychotic experience. Coming off benzodiazepines and THC could also be precipitating this. We plan to give him a stat dose of Olanzapine and reevaluate   Psychiatric: Substance Induced Psychotic Disorder SUD MDD recurrent  Medical: Hypothyroidism  Psychosocial:  Inability to function at work Inability to function at home.   PLAN: 1. Olanzapine 5 mg stat 2. Continue to monitor mood, behavior and interaction with peers 3. Continue to encourage unit groups and activities.    Artist Beach, MD 07/08/2016, 2:57 PMPatient ID: Zachary Dean, male   DOB: January 05, 1981, 36 y.o.   MRN: [id number]

## 2016-07-08 NOTE — Progress Notes (Signed)
Recreation Therapy Notes  Date: 07/08/16 Time: 0930 Location: [address] Dayroom  Group Topic: Stress Management  Goal Area(s) Addresses:  Patient will verbalize importance of using healthy stress management.  Patient will identify positive emotions associated with healthy stress management.   Intervention: Stress Management  Activity :  Body Scan Meditation.  LRT introduced the stress management technique of meditation.  LRT played a meditation from the Calm app that focused on body scan to examine the feelings and tension within the body.  Patients were to follow along with the meditation to played to engage in the technique.  Education:  Stress Management, Discharge Planning.   Education Outcome: Acknowledges edcuation/In group clarification offered/Needs additional education  Clinical Observations/Feedback: Pt did not attend group.   Victorino Sparrow, LRT/CTRS         Victorino Sparrow A 07/08/2016 11:23 AM

## 2016-07-08 NOTE — Progress Notes (Signed)
Brief call note.  Received a call given patient continues to have severe anxiety refractory to prn zyprexa. Patient declines to take anticholinergic given history of worsening restless leg. Per chart review,  UDS positive for benzo, amphetamine, THC on admission. Per nursing report, patient does not remember benzodiazepine use except librium which was given at ED before admission. Rechecked vitals are within normal range except slight hypertension (tachycardia is resolved). Per nursing report, patient went back to bed and reports 1/10 anxiety. Will start gabapentin 300 mg q6hprn for anxiety. Continue to monitor vital signs; may consider starting CIWA if any signs concerning for benzodiazepine withdrawal given unclear clinical history. Also monitor signs for akathisia.  - Start gabapentin 300 mg q6hprn for anxiety

## 2016-07-08 NOTE — Progress Notes (Signed)
D: Patient isolative this AM but slightly more visible in the milieu overall. Spoke with patient 1:1 who remains anxious, nervous and preoccupied on assessment/observation however patient denies feeling anxious. States he is "worried he will do the wrong thing. Do I have to be in the dayroom? I heard it's mandatory. I don't want to mess up." Per self inventory, rates depression, hopelessness and anxiety all at a 1/10. Rates sleep as good, appetite as good, energy as normal and concentration as good. Patient's affect worried, mood anxious. States goal for today is to "be discharged and meet with a doctor." Denies pain, physical problems.   A: Medicated per orders, no prns requested or required. Emotional support offered and self inventory reviewed. Encouraged completion of Suicide Safety Plan. Discussed POC with MD, SW.    R: Patient verbalizes understanding of POC.  Patient denies SI/HI and remains safe on level III obs.   Add note: Patient approached this writer mid afternoon and stated, "I think my BP is high." VS obtained - 152/96, 110 sitting. MD notified and one time order received for zydis 5mg  which was given without difficulty. Patient calmer on reassess. Will continue to monitor and support.

## 2016-07-09 MED ORDER — OLANZAPINE 5 MG PO TABS
5.0000 mg | ORAL_TABLET | Freq: Two times a day (BID) | ORAL | Status: DC | PRN
  Administered 2016-07-09 – 2016-07-10 (×2): 5 mg via ORAL
  Filled 2016-07-09 (×2): qty 2

## 2016-07-09 MED ORDER — OLANZAPINE 10 MG PO TBDP
20.0000 mg | ORAL_TABLET | Freq: Every day | ORAL | Status: DC
  Administered 2016-07-09 – 2016-07-10 (×2): 20 mg via ORAL
  Filled 2016-07-09 (×4): qty 2
  Filled 2016-07-09: qty 8

## 2016-07-09 NOTE — Progress Notes (Signed)
[facility] MD Progress Note  07/09/2016 1:20 PM Zachary Dean  MRN:  [id number] Subjective:  36 yo Caucasian male, married, employed. Background history of Depression, anxiety and SUD. Patient referred in for admission by his psychiatrist. His wife reports a week history of bizarre behavior. He is stated to have expressed belief that their house and Internet has been hacked. He has been expressing abnormal perceptions. He has become guarded and easily agitated. UDS was positive for amphetamines, THC and benzodiazepines.    Nursing staff reports that he was agitated yesterday evening. He was overwhelmed by a peer who was loud. He requested for a PRN but was deescalated verbally. Earlier in the day he was anxious because he was worried he would never get out of here. He responded well to Olanzapine then.  Today patient is coming out more. He is grooming self. He has been out in the courtyard. He has not expressed any agitation or anxiety. He has not reported any delusions. He has not been observed to be internally stimulated.  Seen today. Says he feels better. His mind is clearer. He was able to elaborate on how he became paranoid. Patient states that he has always been a schizoid type person. Says at work, a video was shown which encouraged them to be aware of people who are "odd". The video elaborated on how to disarm them if they become a threat to others. Patient states that he felt they were making reference to him. Says he became very stressed at work. He began to feel that his work has hacked into his phone and wifi at home. Says he felt they were out to get him at work. Patient states that he is beginning to realize that it is not true. Says he has not felt paranoid today. He does not feel anyone here is out to get him. No hallucinations here. He is tolerating his medication well. No suicidal or homicidal thoughts.  Says his wife has not come to visit him since Tuesday. She would be able to  visit on Saturday. Patient says he hopes he can get back to work next week.  Principal Problem: Substance Induced Psychotic Disorder Diagnosis:   Patient Active Problem List   Diagnosis Date Noted  . 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 GAD (generalized anxiety disorder) [F41.1] 07/05/2016  . Restless leg syndrome [G25.81] 07/05/2016  . MDD (major depressive disorder), recurrent, severe, with psychosis ([place]) [F33.3] 07/05/2016   Total Time spent with patient: 20 minutes  Past Psychiatric History: As in H&P  Past Medical History:  Past Medical History:  Diagnosis Date  . Anxiety   . Depression   . Hypothyroid   . Thyroid disease     Past Surgical History:  Procedure Laterality Date  . TONSILLECTOMY     Family History:  Family History  Problem Relation Age of Onset  . Cancer Father   . Diabetes Father    Family Psychiatric  History: As in H&P Social History:  History  Alcohol use Not on file     History  Drug use: Unknown    Social History   Social History  . Marital status: Married    Spouse name: N/A  . Number of children: N/A  . Years of education: N/A   Social History Main Topics  . Smoking status: Current Every Day Smoker    Packs/day: 2.00    Years: 5.00  . Smokeless tobacco: Never Used  . Alcohol use  None  . Drug use: Unknown  . Sexual activity: Yes    Birth control/ protection: None   Other Topics Concern  . None   Social History Narrative  . None   Additional Social History:    Pain Medications: none History of alcohol / drug use?: No history of alcohol / drug abuse Negative Consequences of Use: Personal relationships, Work / School Withdrawal Symptoms: Aggressive/Assaultive      Has an uncle with schizophrenia    Sleep: Good  Appetite:  Good  Current Medications: Current Facility-Administered Medications  Medication Dose Route Frequency Provider Last Rate Last Dose  . acetaminophen  (TYLENOL) tablet 650 mg  650 mg Oral Q6H PRN Shuvon B Rankin, NP      . gabapentin (NEURONTIN) capsule 300 mg  300 mg Oral TID Jenne Campus, MD   300 mg at 07/09/16 1159  . gabapentin (NEURONTIN) capsule 300 mg  300 mg Oral QID PRN Norman Clay, MD      . hydrOXYzine (ATARAX/VISTARIL) tablet 25-50 mg  25-50 mg Oral Q6H PRN Benjamine Mola, FNP      . levothyroxine (SYNTHROID, LEVOTHROID) tablet 25 mcg  25 mcg Oral QAC breakfast Shuvon B Rankin, NP   25 mcg at 07/09/16 9470  . magnesium hydroxide (MILK OF MAGNESIA) suspension 30 mL  30 mL Oral Daily PRN Shuvon B Rankin, NP   30 mL at 07/07/16 9628  . multivitamin with minerals tablet 1 tablet  1 tablet Oral Daily Shuvon B Rankin, NP   1 tablet at 07/09/16 0833  . nicotine (NICODERM CQ - dosed in mg/24 hours) patch 21 mg  21 mg Transdermal Daily Jenne Campus, MD   21 mg at 07/09/16 0834  . OLANZapine zydis (ZYPREXA) disintegrating tablet 15 mg  15 mg Oral QHS Artist Beach, MD   15 mg at 07/08/16 2201  . venlafaxine XR (EFFEXOR-XR) 24 hr capsule 150 mg  150 mg Oral Q breakfast Shuvon B Rankin, NP   150 mg at 07/09/16 3662    Lab Results:  No results found for this or any previous visit (from the past 48 hour(s)).  Blood Alcohol level:  Lab Results  Component Value Date   ETH <5 94/76/5465    Metabolic Disorder Labs: Lab Results  Component Value Date   HGBA1C 5.1 07/07/2016   MPG 100 07/07/2016   Lab Results  Component Value Date   PROLACTIN 31.6 (H) 07/07/2016   Lab Results  Component Value Date   CHOL 136 07/07/2016   TRIG 82 07/07/2016   HDL 65 07/07/2016   CHOLHDL 2.1 07/07/2016   VLDL 16 07/07/2016   LDLCALC 55 07/07/2016    Physical Findings: AIMS: Facial and Oral Movements Muscles of Facial Expression: None, normal Lips and Perioral Area: None, normal Jaw: None, normal Tongue: None, normal,Extremity Movements Upper (arms, wrists, hands, fingers): None, normal Lower (legs, knees, ankles, toes): None,  normal, Trunk Movements Neck, shoulders, hips: None, normal, Overall Severity Severity of abnormal movements (highest score from questions above): None, normal Incapacitation due to abnormal movements: None, normal Patient's awareness of abnormal movements (rate only patient's report): No Awareness, Dental Status Current problems with teeth and/or dentures?: No Does patient usually wear dentures?: No  CIWA:  CIWA-Ar Total: 6 COWS:  COWS Total Score: 2  Musculoskeletal: Strength & Muscle Tone: within normal limits Gait & Station: normal Patient leans: N/A  Psychiatric Specialty Exam: Physical Exam  Constitutional: He is oriented to person, place, and time. He  appears well-developed and well-nourished.  HENT:  Head: Normocephalic and atraumatic.  Eyes: Conjunctivae and EOM are normal. Pupils are equal, round, and reactive to light.  Neck: Normal range of motion. Neck supple.  Cardiovascular: Normal rate, regular rhythm and normal heart sounds.   Respiratory: Effort normal and breath sounds normal.  GI: Soft. Bowel sounds are normal.  Musculoskeletal: Normal range of motion.  Neurological: He is alert and oriented to person, place, and time. He has normal reflexes.  Skin: Skin is warm and dry.  Psychiatric:  As above    ROS  Blood pressure (!) 142/97, pulse 75, temperature 97.8 F (36.6 C), temperature source Oral, resp. rate 16, height 5\' 7"  (1.702 m), weight 53.1 kg (117 lb), SpO2 100 %.Body mass index is 18.32 kg/m.  General Appearance: Neatly dressed, good eye contact. Good relatedness. No longer perplexed or guarded. Not internally distracted.   Eye Contact:  Good  Speech:  Spontaneous. Soft spoken  Volume:  Decreased  Mood: Feels better  Affect:  Mobilizing more affect appropriately.   Thought Process:  Linear and goal directed  Orientation:  Full (Time, Place, and Person)  Thought Content:  He is process the delusions he came in with. He is now able to see they are not  reality based. No preoccupation with violent thoughts. No negative ruminations. No obsession.  No hallucination in any modality.   Suicidal Thoughts:  No  Homicidal Thoughts:  No  Memory:  WNL  Judgement:  Better  Insight:  Better  Psychomotor Activity:  Better  Concentration:  Better   Recall:  Good  Fund of Knowledge:  Fair  Language:  Good  Akathisia:  No  Handed:    AIMS (if indicated):     Assets:  Housing Intimacy Physical Health Vocational/Educational  ADL's:  Impaired  Cognition:  WNL  Sleep:  Number of Hours: 6.75     Treatment Plan Summary:  Psychosis is gradually resolving. He is becoming more reality based. Periods of anxiety related to his psychotic experience. We plan to adjust his medications further today. Hopeful discharge by Monday.    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   PLAN: 1. Increase Olanzapine to 20 mg HS 2. PRN Olanzapine 5 mg for agitation/anxiety 3. Discontinue Hydroxyzine.  4. Continue to monitor mood, behavior and interaction with peers 5. Continue to encourage unit groups and activities.  6. Feedback from his wife when she visits.    Artist Beach, MD 07/09/2016, 1:20 PMPatient ID: Zachary Dean, male   DOB: 1980/04/23, 36 y.o.   MRN: [id number] Patient ID: Zachary Dean, male   DOB: Nov 26, 1980, 36 y.o.   MRN: [id number]

## 2016-07-09 NOTE — Progress Notes (Signed)
D: Patient denies SI/HI or AVH. Patient appears suspicious/cautious but pleasant and cooperative.  He has taken more effort to groom himself today and has attended groups including participating in outdoor activity.  Pt. Has been interacting appropriately with others on the unit as well as staff.  Pt. Reports fair sleep/appetite and states his concentration is "better, more clear".  A: Patient given emotional support from RN. Patient encouraged to come to staff with concerns and/or questions. Patient's medication routine continued. Patient's orders and plan of care reviewed.   R: Patient remains appropriate and cooperative. Will continue to monitor patient q15 minutes for safety.

## 2016-07-09 NOTE — B[facility] Suicide Risk Assessment (Signed)
[place] INPATIENT:  Family/Significant Other Suicide Prevention Education  Suicide Prevention Education:  Education Completed; Travonta Gill (pt's wife) [phone] has been identified by the patient as the family member/significant other with whom the patient will be residing, and identified as the person(s) who will aid the patient in the event of a mental health crisis (suicidal ideations/suicide attempt).  With written consent from the patient, the family member/significant other has been provided the following suicide prevention education, prior to the and/or following the discharge of the patient.  The suicide prevention education provided includes the following:  Suicide risk factors  Suicide prevention and interventions  National Suicide Hotline telephone number  [facility] assessment telephone number  [facility] Emergency Assistance [place] and/or Residential Mobile Crisis Unit telephone number  Request made of family/significant other to:  Remove weapons (e.g., guns, rifles, knives), all items previously/currently identified as safety concern.    Remove drugs/medications (over-the-counter, prescriptions, illicit drugs), all items previously/currently identified as a safety concern.  The family member/significant other verbalizes understanding of the suicide prevention education information provided.  The family member/significant other agrees to remove the items of safety concern listed above.  [name] is anxious to speak with MD regarding pt's medications and her concerns. MD notified and plans to call her Friday morning. [name] has no concerns regarding pt returning home and no concerns regarding SI/HI.   Barbette Mcglaun N Smart LCSW 07/09/2016, 4:01 PM

## 2016-07-09 NOTE — Progress Notes (Signed)
D   Pt is pleasant on approach and cooperative   He attended wrap up group and was appropriate with others    He endorses some depression and anxiety but reports some improvement as well   He also reports his thoughts are clearing and he does not feel as paranoid  A   Verbal support given   Medications administered and effectiveness monitored   Q 15 min checks  R   Pt is safe at present

## 2016-07-09 NOTE — BHH Group Notes (Signed)
[place] LCSW Group Therapy  07/09/2016 12:04 PM  Type of Therapy:  Group Therapy  Participation Level:  Active  Participation Quality:  Attentive  Affect:  Appropriate  Cognitive:  Alert  Insight:  Improving  Engagement in Therapy:  Improving  Modes of Intervention:  Confrontation, Discussion, Education, Problem-solving, Socialization and Support  Summary of Progress/Problems: Self Sabotage: Group Members were asked to identify the meaning of self sabotage and how this applies in their lives. Group members were asked to explore what types of self sabotage are getting in their way and processed plans for change in order to address these obstacles.   Krimson Massmann N Smart LCSW 07/09/2016, 12:04 PM

## 2016-07-10 NOTE — Progress Notes (Signed)
Pt said he had a hard time falling asleep last night but after he did he rested well

## 2016-07-10 NOTE — Progress Notes (Signed)
Recreation Therapy Notes  Date: 07/10/16 Time: 0930 Location: [address] Dayroom  Group Topic: Stress Management  Goal Area(s) Addresses:  Patient will verbalize importance of using healthy stress management.  Patient will identify positive emotions associated with healthy stress management.   Behavioral Response: Engaged  Intervention: Stress Management  Activity :  Peaceful [place].  LRT introduced the stress management technique of guided imagery.  LRT read a script to allow patients to engage in the activity.  Patients were to follow along as LRT read the script to participate in activity.  Education:  Stress Management, Discharge Planning.   Education Outcome: Acknowledges edcuation/In group clarification offered/Needs additional education  Clinical Observations/Feedback: Pt attended group.   Victorino Sparrow, LRT/CTRS         Victorino Sparrow A 07/10/2016 11:54 AM

## 2016-07-10 NOTE — Progress Notes (Signed)
D:  Patient's self inventory sheet, patient sleeps good, no sleep medication given.  Good appetite, normal energy level, good concentration.  Rated depression and hopeless 1, anxiety 2.  Denied withdrawals.  Denied SI.  Physical problems denied.  Denied pain.  Goal is to discharge.  Will discuss with MD.  Does have discharge plans. A:  Medications administered per MD orders.  Emotional support and encouragement. R:  Denied SI and HI, contracts for safety.  Denied A/V hallucinations.  Safety maintained with 15 minute checks.

## 2016-07-10 NOTE — Progress Notes (Signed)
  [facility] Adult Case Management Discharge Plan :  Will you be returning to the same living situation after discharge:  Yes,  home with wife At discharge, do you have transportation home?: Yes,  wife coming on Saturday after lunch to pick him up Do you have the ability to pay for your medications: Yes,  [facility]  Release of information consent forms completed and submitted to medical records by CSW.  Patient to Follow up at: Follow-up Information    [facility] Follow up on 07/16/2016.   Why:  Appt on Thursday at 11:00AM with Dr. Toy Care for hospital follow-up/medication management. Thank you.  Contact information: [address] [place], [place] [address] Phone: [phone] Fax: [phone]           Next level of care provider has access to [place] and Suicide Prevention discussed: Yes,  [name]  Have you used any form of tobacco in the last 30 days? (Cigarettes, Smokeless Tobacco, Cigars, and/or Pipes): Yes  Has patient been referred to the Quitline?: Patient refused referral  Patient has been referred for addiction treatment: Yes  Zachary Dean N Smart LCSW 07/10/2016, 12:58 PM

## 2016-07-10 NOTE — Progress Notes (Signed)
Pt attended the evening AA speaker meeting. Pt was engaged and appropriate. Dahlia Bailiff C, NT 07/10/16 10:00 PM

## 2016-07-10 NOTE — BHH Group Notes (Signed)
[place] LCSW Group Therapy  07/10/2016 3:01 PM  Type of Therapy:  Group Therapy  Participation Level:  Active  Participation Quality:  Attentive  Affect:  Appropriate  Cognitive:  Oriented  Insight:  Improving  Engagement in Therapy:  Improving  Modes of Intervention:  Confrontation, Discussion, Education, Problem-solving, Socialization and Support  Summary of Progress/Problems: Feelings around Relapse. Group members discussed the meaning of relapse and shared personal stories of relapse, how it affected them and others, and how they perceived themselves during this time. Group members were encouraged to identify triggers, warning signs and coping skills used when facing the possibility of relapse. Social supports were discussed and explored in detail.  Lexine Jaspers N Smart LCSW 07/10/2016, 3:01 PM

## 2016-07-10 NOTE — BHH Group Notes (Signed)
Pt did not attend Psychoeducational group with the tech.

## 2016-07-10 NOTE — Progress Notes (Signed)
[facility] MD Progress Note  07/10/2016 10:20 AM Zachary Dean  MRN:  [id number] Subjective:  36 yo Caucasian male, married, employed. Background history of Depression, anxiety and SUD. Patient referred in for admission by his psychiatrist. His wife reports a week history of bizarre behavior. He is stated to have expressed belief that their house and Internet has been hacked. He has been expressing abnormal perceptions. He has become guarded and easily agitated. UDS was positive for amphetamines, THC and benzodiazepines.   I spoke to his wife Zachary Dean today. She came to visit him last night. She observes that he is much better. He was not hallucinating anymore. He was interactive and was able to process information quicker. Notes that he did not express any delusions. She feels he can come home soon. Says he has planned vacation in two weeks time. Would prefer he takes next week off work. No concerns about harm to self or others. Wife has concerns about his use of THC. Wants [place] to reinforce that to him.   Nursing staff reports that he has been socializing more. He took PRN Olanzapine yesterday. Good effect. No agitation. No behavioral issues. He has not been observed to be internally stimulated. He has not expressed any odd belief.  Seen today. I reviewed his chart prior to interview. Patient reports feeling good. Says he did not have any anxiety yesterday. He requested for PRN Olanzapine because he had some hesitancy before voiding. Says he thought it would help relax him so he can void. He is not expressing any delusions. He is not expressing any hallucination. He plans to get back to his work after his week off. No thoughts of violence towards his peers at work. No thoughts of violence towards anyone. No suicidal or homicidal thoughts. We discussed effects of THC again. Patient seems to have better insight on how it affects his mind. He has pledged to stay off this substance.    Principal  Problem: Substance Induced Psychotic Disorder Diagnosis:   Patient Active Problem List   Diagnosis Date Noted  . 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 GAD (generalized anxiety disorder) [F41.1] 07/05/2016  . Restless leg syndrome [G25.81] 07/05/2016  . MDD (major depressive disorder), recurrent, severe, with psychosis ([place]) [F33.3] 07/05/2016   Total Time spent with patient: 20 minutes  Past Psychiatric History: As in H&P  Past Medical History:  Past Medical History:  Diagnosis Date  . Anxiety   . Depression   . Hypothyroid   . Thyroid disease     Past Surgical History:  Procedure Laterality Date  . TONSILLECTOMY     Family History:  Family History  Problem Relation Age of Onset  . Cancer Father   . Diabetes Father    Family Psychiatric  History: As in H&P Social History:  History  Alcohol use Not on file     History  Drug use: Unknown    Social History   Social History  . Marital status: Married    Spouse name: N/A  . Number of children: N/A  . Years of education: N/A   Social History Main Topics  . Smoking status: Current Every Day Smoker    Packs/day: 2.00    Years: 5.00  . Smokeless tobacco: Never Used  . Alcohol use None  . Drug use: Unknown  . Sexual activity: Yes    Birth control/ protection: None   Other Topics Concern  . None   Social History Narrative  .  None   Additional Social History:    Pain Medications: none History of alcohol / drug use?: No history of alcohol / drug abuse Negative Consequences of Use: Personal relationships, Work / School Withdrawal Symptoms: Aggressive/Assaultive      Has an uncle with schizophrenia    Sleep: Good  Appetite:  Good  Current Medications: Current Facility-Administered Medications  Medication Dose Route Frequency Provider Last Rate Last Dose  . acetaminophen (TYLENOL) tablet 650 mg  650 mg Oral Q6H PRN Shuvon B Rankin, NP      . gabapentin  (NEURONTIN) capsule 300 mg  300 mg Oral TID Jenne Campus, MD   300 mg at 07/10/16 0823  . gabapentin (NEURONTIN) capsule 300 mg  300 mg Oral QID PRN Norman Clay, MD   300 mg at 07/09/16 2104  . levothyroxine (SYNTHROID, LEVOTHROID) tablet 25 mcg  25 mcg Oral QAC breakfast Shuvon B Rankin, NP   25 mcg at 07/10/16 0615  . magnesium hydroxide (MILK OF MAGNESIA) suspension 30 mL  30 mL Oral Daily PRN Shuvon B Rankin, NP   30 mL at 07/07/16 8676  . multivitamin with minerals tablet 1 tablet  1 tablet Oral Daily Shuvon B Rankin, NP   1 tablet at 07/10/16 7209  . nicotine (NICODERM CQ - dosed in mg/24 hours) patch 21 mg  21 mg Transdermal Daily Jenne Campus, MD   21 mg at 07/10/16 0823  . OLANZapine (ZYPREXA) tablet 5 mg  5 mg Oral Q12H PRN Artist Beach, MD   5 mg at 07/10/16 1015  . OLANZapine zydis (ZYPREXA) disintegrating tablet 20 mg  20 mg Oral QHS Artist Beach, MD   20 mg at 07/09/16 2104  . venlafaxine XR (EFFEXOR-XR) 24 hr capsule 150 mg  150 mg Oral Q breakfast Shuvon B Rankin, NP   150 mg at 07/10/16 4709    Lab Results:  No results found for this or any previous visit (from the past 48 hour(s)).  Blood Alcohol level:  Lab Results  Component Value Date   ETH <5 62/83/6629    Metabolic Disorder Labs: Lab Results  Component Value Date   HGBA1C 5.1 07/07/2016   MPG 100 07/07/2016   Lab Results  Component Value Date   PROLACTIN 31.6 (H) 07/07/2016   Lab Results  Component Value Date   CHOL 136 07/07/2016   TRIG 82 07/07/2016   HDL 65 07/07/2016   CHOLHDL 2.1 07/07/2016   VLDL 16 07/07/2016   LDLCALC 55 07/07/2016    Physical Findings: AIMS: Facial and Oral Movements Muscles of Facial Expression: None, normal Lips and Perioral Area: None, normal Jaw: None, normal Tongue: None, normal,Extremity Movements Upper (arms, wrists, hands, fingers): None, normal Lower (legs, knees, ankles, toes): None, normal, Trunk Movements Neck, shoulders, hips: None,  normal, Overall Severity Severity of abnormal movements (highest score from questions above): None, normal Incapacitation due to abnormal movements: None, normal Patient's awareness of abnormal movements (rate only patient's report): No Awareness, Dental Status Current problems with teeth and/or dentures?: No Does patient usually wear dentures?: No  CIWA:  CIWA-Ar Total: 6 COWS:  COWS Total Score: 2  Musculoskeletal: Strength & Muscle Tone: within normal limits Gait & Station: normal Patient leans: N/A  Psychiatric Specialty Exam: Physical Exam  Constitutional: He is oriented to person, place, and time. He appears well-developed and well-nourished.  HENT:  Head: Normocephalic and atraumatic.  Eyes: Conjunctivae and EOM are normal. Pupils are equal, round, and reactive to light.  Neck: Normal range of motion. Neck supple.  Cardiovascular: Normal rate, regular rhythm and normal heart sounds.   Respiratory: Effort normal and breath sounds normal.  GI: Soft. Bowel sounds are normal.  Musculoskeletal: Normal range of motion.  Neurological: He is alert and oriented to person, place, and time. He has normal reflexes.  Skin: Skin is warm and dry.  Psychiatric:  As above    ROS  Blood pressure (!) 154/93, pulse 70, temperature 97.8 F (36.6 C), temperature source Oral, resp. rate 16, height 5\' 7"  (1.702 m), weight 53.1 kg (117 lb), SpO2 100 %.Body mass index is 18.32 kg/m.  General Appearance: Neatly dressed, pleasant, engaging well and cooperative. Appropriate behavior. Not in any distress. Good relatedness. Not internally stimulated   Eye Contact:  Good  Speech:  Spontaneous. Normal rate and tone  Volume:  Normal  Mood: Ethymic  Affect:  Full range and appropriate   Thought Process:  Linear and goal directed  Orientation:  Full (Time, Place, and Person)  Thought Content: No delusional theme. No preoccupation with violent thoughts. No negative ruminations. No obsession.  No  hallucination in any modality.    Suicidal Thoughts:  No  Homicidal Thoughts:  No  Memory:  WNL  Judgement:  Good  Insight:  Good  Psychomotor Activity:  Normal   Concentration:  Good  Recall:  Good  Fund of Knowledge:  Good  Language:  Good  Akathisia:  No  Handed:    AIMS (if indicated):     Assets:  Housing Intimacy Physical Health Vocational/Educational  ADL's:  Impaired  Cognition:  WNL  Sleep:  Number of Hours: 6.25     Treatment Plan Summary:  Patient is responding well to treatment. He is not a danger to himself or others. Hopeful discharge tomorrow.   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   PLAN: 1. Continue current regimen 2. Continue to monitor mood, behavior and interaction with peers 3. Home tomorrow.    Artist Beach, MD 07/10/2016, 10:20 AMPatient ID: Zachary Dean, male   DOB: 1980/11/14, 36 y.o.   MRN: [id number] Patient ID: Zachary Dean, male   DOB: [birth year], 36 y.o.   MRN: [id number] Patient ID: Zachary Dean, male   DOB: Sep 18, 1980, 36 y.o.   MRN: [id number]

## 2016-07-10 NOTE — Tx Team (Signed)
Interdisciplinary Treatment and Diagnostic Plan Update  07/10/2016 Time of Session: 0930 Zachary Dean MRN: [id number]  Principal Diagnosis: Substance-induced psychotic disorder with delusions (HCC)  Secondary Diagnoses: Principal Problem:   Substance-induced psychotic disorder with delusions ([facility]) Active Problems:   GAD (generalized anxiety disorder)   Restless leg syndrome   MDD (major depressive disorder), recurrent, severe, with psychosis (HCC)   Current Medications:  Current Facility-Administered Medications  Medication Dose Route Frequency Provider Last Rate Last Dose  . acetaminophen (TYLENOL) tablet 650 mg  650 mg Oral Q6H PRN Shuvon B Rankin, NP      . gabapentin (NEURONTIN) capsule 300 mg  300 mg Oral TID Craige Cotta, MD   300 mg at 07/10/16 1222  . gabapentin (NEURONTIN) capsule 300 mg  300 mg Oral QID PRN Neysa Hotter, MD   300 mg at 07/09/16 2104  . levothyroxine (SYNTHROID, LEVOTHROID) tablet 25 mcg  25 mcg Oral QAC breakfast Shuvon B Rankin, NP   25 mcg at 07/10/16 0615  . magnesium hydroxide (MILK OF MAGNESIA) suspension 30 mL  30 mL Oral Daily PRN Shuvon B Rankin, NP   30 mL at 07/07/16 1314  . multivitamin with minerals tablet 1 tablet  1 tablet Oral Daily Shuvon B Rankin, NP   1 tablet at 07/10/16 3888  . nicotine (NICODERM CQ - dosed in mg/24 hours) patch 21 mg  21 mg Transdermal Daily Craige Cotta, MD   21 mg at 07/10/16 0823  . OLANZapine (ZYPREXA) tablet 5 mg  5 mg Oral Q12H PRN Georgiann Cocker, MD   5 mg at 07/10/16 1015  . OLANZapine zydis (ZYPREXA) disintegrating tablet 20 mg  20 mg Oral QHS Georgiann Cocker, MD   20 mg at 07/09/16 2104  . venlafaxine XR (EFFEXOR-XR) 24 hr capsule 150 mg  150 mg Oral Q breakfast Shuvon B Rankin, NP   150 mg at 07/10/16 7579   PTA Medications: Prescriptions Prior to Admission  Medication Sig Dispense Refill Last Dose  . B Complex Vitamins (B-COMPLEX/B-12 PO) Take 1 tablet by mouth daily.     .  chlordiazePOXIDE (LIBRIUM) 5 MG capsule Take 10 mg by mouth at bedtime.     . gabapentin (NEURONTIN) 600 MG tablet Take 600 mg by mouth 4 (four) times daily.     Marland Kitchen ibuprofen (ADVIL,MOTRIN) 200 MG tablet Take 800-1,000 mg by mouth every 6 (six) hours as needed for headache.     . levothyroxine (SYNTHROID, LEVOTHROID) 25 MCG tablet Take 25 mcg by mouth daily.  4   . Multiple Vitamin (MULTIVITAMIN WITH MINERALS) TABS tablet Take 1 tablet by mouth daily.   Unknown at Unknown time  . olanzapine-FLUoxetine (SYMBYAX) 6-25 MG per capsule Take 1 capsule by mouth every evening.   Unknown at Unknown time  . OVER THE COUNTER MEDICATION Take 1 tablet by mouth 3 (three) times daily. Hydroxycut   Unknown at Unknown time  . rOPINIRole (REQUIP) 1 MG tablet Take 1 mg by mouth at bedtime.  4 Unknown at Unknown time  . topiramate (TOPAMAX) 25 MG tablet Take 50 mg by mouth at bedtime.   Unknown at Unknown time  . venlafaxine XR (EFFEXOR-XR) 75 MG 24 hr capsule Take 75 mg by mouth 2 (two) times daily.  4 Unknown at Unknown time    Patient Stressors: Educational concerns Financial difficulties Health problems  Patient Strengths: Ability for insight Active sense of humor  Treatment Modalities: Medication Management, Group therapy, Case management,  1 to  1 session with clinician, Psychoeducation, Recreational therapy.   Physician Treatment Plan for Primary Diagnosis: Substance-induced psychotic disorder with delusions ([place]) Long Term Goal(s): Improvement in symptoms so as ready for discharge Improvement in symptoms so as ready for discharge   Short Term Goals: Ability to verbalize feelings will improve Ability to disclose and discuss suicidal ideas Ability to demonstrate self-control will improve Ability to identify and develop effective coping behaviors will improve Ability to maintain clinical measurements within normal limits will improve Ability to verbalize feelings will improve Ability to disclose and  discuss suicidal ideas Ability to demonstrate self-control will improve Ability to identify and develop effective coping behaviors will improve Ability to maintain clinical measurements within normal limits will improve  Medication Management: Evaluate patient's response, side effects, and tolerance of medication regimen.  Therapeutic Interventions: 1 to 1 sessions, Unit Group sessions and Medication administration.  Evaluation of Outcomes: Met  Physician Treatment Plan for Secondary Diagnosis: Principal Problem:   Substance-induced psychotic disorder with delusions ([place]) Active Problems:   GAD (generalized anxiety disorder)   Restless leg syndrome   MDD (major depressive disorder), recurrent, severe, with psychosis ([place])  Long Term Goal(s): Improvement in symptoms so as ready for discharge Improvement in symptoms so as ready for discharge   Short Term Goals: Ability to verbalize feelings will improve Ability to disclose and discuss suicidal ideas Ability to demonstrate self-control will improve Ability to identify and develop effective coping behaviors will improve Ability to maintain clinical measurements within normal limits will improve Ability to verbalize feelings will improve Ability to disclose and discuss suicidal ideas Ability to demonstrate self-control will improve Ability to identify and develop effective coping behaviors will improve Ability to maintain clinical measurements within normal limits will improve     Medication Management: Evaluate patient's response, side effects, and tolerance of medication regimen.  Therapeutic Interventions: 1 to 1 sessions, Unit Group sessions and Medication administration.  Evaluation of Outcomes: Met  RN Treatment Plan for Primary Diagnosis: Substance-induced psychotic disorder with delusions ([place]) Long Term Goal(s): Knowledge of disease and therapeutic regimen to maintain health will improve  Short Term Goals: Ability to  remain free from injury will improve, Ability to verbalize feelings will improve and Ability to disclose and discuss suicidal ideas  Medication Management: RN will administer medications as ordered by provider, will assess and evaluate patient's response and provide education to patient for prescribed medication. RN will report any adverse and/or side effects to prescribing provider.  Therapeutic Interventions: 1 on 1 counseling sessions, Psychoeducation, Medication administration, Evaluate responses to treatment, Monitor vital signs and CBGs as ordered, Perform/monitor CIWA, COWS, AIMS and Fall Risk screenings as ordered, Perform wound care treatments as ordered.  Evaluation of Outcomes: Met  LCSW Treatment Plan for Primary Diagnosis: Substance-induced psychotic disorder with delusions ([place]) Long Term Goal(s): Safe transition to appropriate next level of care at discharge, Engage patient in therapeutic group addressing interpersonal concerns.  Short Term Goals: Engage patient in aftercare planning with referrals and resources, Facilitate patient progression through stages of change regarding substance use diagnoses and concerns and Identify triggers associated with mental health/substance abuse issues  Therapeutic Interventions: Assess for all discharge needs, 1 to 1 time with Social worker, Explore available resources and support systems, Assess for adequacy in community support network, Educate family and significant other(s) on suicide prevention, Complete Psychosocial Assessment, Interpersonal group therapy.  Evaluation of Outcomes: Met   Progress in Treatment: Attending groups: Yes Participating in groups: Yes Taking medication as prescribed: Yes. Toleration  medication: Yes. Family/Significant other contact made: [name]; collateral information also obtained.  Patient understands diagnosis: Yes. Discussing patient identified problems/goals with staff: Yes. Medical  problems stabilized or resolved: Yes. Denies suicidal/homicidal ideation: Yes. Issues/concerns per patient self-inventory: No. Other: n/a   New problem(s) identified: No.   New Short Term/Long Term Goal(s): medication stabilization; development of comprehensive mental wellness plan.   Discharge Plan or Barriers: Pt sees Dr. Toy Care --appt scheduled for next week.   Reason for Continuation of Hospitalization: medication management.   Estimated Length of Stay: 1 day (discharge scheduled for Saturday).   Attendees: Patient: 07/10/2016 12:59 PM  Physician: Dr. Sanjuana Letters MD 07/10/2016 12:59 PM  Nursing: Parthenia Ames RN 07/10/2016 12:59 PM  RN Care Manager: Lars Pinks CM 07/10/2016 12:59 PM  Social Worker: Maxie Better, LCSW 07/10/2016 12:59 PM  Recreational Therapist: Rhunette Croft 07/10/2016 12:59 PM  Other: Lindell Spar NP 07/10/2016 12:59 PM  Other:  07/10/2016 12:59 PM  Other: 07/10/2016 12:59 PM    Scribe for Treatment Team: [place], LCSW 07/10/2016 12:59 PM

## 2016-07-10 NOTE — Progress Notes (Signed)
Date:  07/10/2016 Time:  1:09 AM  Group Topic/Focus:  Wrap-Up Group:   The focus of this group is to help patients review their daily goal of treatment and discuss progress on daily workbooks.  Participation Level:  Active  Participation Quality:  Appropriate and Attentive  Affect:  Appropriate  Cognitive:  Appropriate  Insight: Appropriate  Engagement in Group:  Engaged  Modes of Intervention:  Discussion  Additional Comments:  Pt stated his goal was to get his medications straight and talk to the doctor. Pt stated he felt like he was able to communicate well and get his thoughts organized. Pt stated he was glad he was able to communicate better today.  Clint Bolder 07/10/2016, 1:09 AM

## 2016-07-10 NOTE — Progress Notes (Signed)
D   Pt is pleasant on approach and cooperative   He attended wrap up group and was appropriate with others    He endorses some depression and anxiety but reports some improvement as well   He also reports his thoughts are clearing and he does not feel as paranoid   Pt is very excited to get discharge tomorrow and reports he feels ready to go A   Verbal support given   Medications administered and effectiveness monitored   Q 15 min checks  R   Pt is safe at present

## 2016-07-10 NOTE — Plan of Care (Signed)
Problem: Activity: Goal: Interest or engagement in leisure activities will improve Outcome: Progressing Nurse discussed depression/anxiety/coping skills with patient.

## 2016-07-11 MED ORDER — VENLAFAXINE HCL ER 150 MG PO CP24: 150.0000 mg | ORAL_CAPSULE | Freq: Every day | ORAL | 0 refills | Status: DC

## 2016-07-11 MED ORDER — GABAPENTIN 300 MG PO CAPS: 300.0000 mg | ORAL_CAPSULE | Freq: Three times a day (TID) | ORAL | 0 refills | Status: DC

## 2016-07-11 MED ORDER — LEVOTHYROXINE SODIUM 25 MCG PO TABS: 25.0000 ug | ORAL_TABLET | Freq: Every day | ORAL | 0 refills | Status: DC

## 2016-07-11 MED ORDER — OLANZAPINE 20 MG PO TBDP: 20.0000 mg | ORAL_TABLET | Freq: Every day | ORAL | 0 refills | Status: DC

## 2016-07-11 NOTE — Discharge Summary (Signed)
Physician Discharge Summary Note  Patient:  Zachary Dean is an 36 y.o., male MRN:  [id number] DOB:  [birth year] Patient phone:  [phone] (home)  Patient address:   [address] [place] [address],  Total Time spent with patient: 30 minutes  Date of Admission:  07/05/2016 Date of Discharge: 07/11/2016 36 year old male . Patient is a limited historian, and has difficulty explaining symptoms or events that led to admission . States that his work's HR requested a medical evaluation to determine whether he can return to work. States " they are worried that I have mental fatigue". He states he made appointment and saw Dr. Toy Dean for this purpose, and states that he was encouraged to come to the hospital , and wife agreed/ brought  him to the hospital .  Patient denies any hallucinations or psychotic symptoms, but reports state that patient has been presenting with thought blocking and at times has appeared internally preoccupied. Patient endorses regular cannabis abuse, denies any alcohol or other drug abuse . Admission UDS was positive for amphetamines, benzodiazepines, cannabis. BAL <5.  With patient's express consent I obtained collateral information from wife. She reports patient is normally high functioning , but that over the last several days to weeks he has been paranoid, delusional , worrying that his internet and phones are being hacked into, telling her there are suspicious footprints outside the home. She has also noted him to be less organized in his speech, and describes more fragmented speech. She states that he had a prior episode about a year ago, similar to this one , but not as severe, and that at that time he responded very well to Zyprexa . Of note,wife states that Synthroid has been prescribed to patient as antidepressant augmentation, not for underlying hypothyroidism  Associated Signs/Symptoms: Depression Symptoms: of note, denies anhedonia or significant sadness,  states sleep has been good, does report decreased energy level and decreased appetite  (Hypo) Manic Symptoms:   None  Anxiety Symptoms:  Describes excessive worrying  Psychotic Symptoms: denies hallucinations, at this time does not appear internally preoccupied. Possible thought blocking  PTSD Symptoms: Does not endorse   Principal Problem: Substance-induced psychotic disorder with delusions [facility]) Discharge Diagnoses: Patient Active Problem List   Diagnosis Date Noted  . Substance-induced psychotic disorder with delusions ([place]) [F19.950] 07/05/2016  . GAD (generalized anxiety disorder) [F41.1] 07/05/2016  . Restless leg syndrome [G25.81] 07/05/2016  . MDD (major depressive disorder), recurrent, severe, with psychosis ([place]) [F33.3] 07/05/2016   Past Psychiatric History:  One prior psychiatric admission in 2006, for depression and suicidal attempt-overdosed on Xanax . Denies any history of self cutting or self injurious behaviors, denies history of violence.Does not endorse any clear history of mania or hypomania.  As reported by wife, has had prior episode of psychosis, which responded to Zyprexa   Prior Inpatient Therapy:  as above  Prior Outpatient Therapy:  had been referred to Dr. Toy Dean   Substance Abuse History in the last 12 months:  Denies alcohol abuse, cannabis abuse, was smoking regularly,  Consequences of Substance Abuse: Denies  Previous Psychotropic Medications: Neurontin, Topamax ,Effexor XR , Librium. States had been on Symbiax in the past, but had not been taking recently .  Psychological Evaluations:  No    Past Medical History:  Past Medical History:  Diagnosis Date  . Anxiety   . Depression   . Hypothyroid   . Thyroid disease     Past Surgical History:  Procedure Laterality  Date  . TONSILLECTOMY     Family History:  Family History  Problem Relation Age of Onset  . Cancer Father   . Diabetes Father    Family Psychiatric  History: father committed suicide  in 2005. States that there is a history of alcohol abuse in his extended family. He states he thinks his mother has history of depression .  Social History:  History  Alcohol use Not on file     History  Drug use: Unknown    Social History   Social History  . Marital status: Married    Spouse name: N/A  . Number of children: N/A  . Years of education: N/A   Social History Main Topics  . Smoking status: Current Every Day Smoker    Packs/day: 2.00    Years: 5.00  . Smokeless tobacco: Never Used  . Alcohol use None  . Drug use: Unknown  . Sexual activity: Yes    Birth control/ protection: None   Other Topics Concern  . None   Social History Narrative  . None    Hospital Course: Patient is a 36 year old male with a PPHX significant for MDD with psychosis, GAD, who presented to IVC under admission.  Zachary Dean was admitted to the hospital with BAL of <5. He does have serious drug problems and was under the influence when he admitted. DUring the admission he was having bad drug withdrawal symptoms; tremors, sweating, irritability, agitation etc. However, his reason for admission was worsening symptoms of poly susbtance abuse, major depression with psychosis requiring mood and crisis stabilization treatment. After evaluation of his symptoms, Zachary Dean was started on medication regimen for his presenting symptoms. He was started on Librium detox protocol to minimize risk of drug  withdrawal symptoms.  His medication regimen included; Neurotonin 300mg  po TID for anxiety, Zyprexa 20mg  po daily for agitation, psychosis, Effexor 150mg  po daily with breakfast.  He was also enrolled & participated in the group counseling sessions being offered and held on this unit, he learned coping skills that should help his cope better and maintain mood stability after discharge. He was also enrolled in the group counseling sessions and AA/NA meetings being offered and held on this unit.   Besides the  detoxification treatments, [name] was medicated with Hydroxyzine 25 mg tid prn as needed for anxiety. He presented other medical issues that required treatment and or monitoring, including hypothyroidism. He tolerated his treatment regimen without any significant adverse effects and or reactions. Mort has completed detox treatment and his mood is stable. Although he displayed some agitation last night and was verbally aggressive toward staff and peers. This is evidenced by his reports of improved mood and absence of withdrawal symptoms. He is currently being discharged to continue substance abuse treatment, medication management and routine psychiatric Dean, at [facility] psychiatry in [place]. Upon discharge, he adamantly denies any SIHI, AVH, delusional thoughts, paranoia and or withdrawal symptoms. He received  samples of his [facility] discharge medications.   Physical Findings: AIMS: Facial and Oral Movements Muscles of Facial Expression: None, normal Lips and Perioral Area: None, normal Jaw: None, normal Tongue: None, normal,Extremity Movements Upper (arms, wrists, hands, fingers): None, normal Lower (legs, knees, ankles, toes): None, normal, Trunk Movements Neck, shoulders, hips: None, normal, Overall Severity Severity of abnormal movements (highest score from questions above): None, normal Incapacitation due to abnormal movements: None, normal Patient's awareness of abnormal movements (rate only patient's report): No Awareness, Dental Status Current problems with teeth and/or dentures?: No  Does patient usually wear dentures?: No  CIWA:  CIWA-Ar Total: 1 COWS:  COWS Total Score: 1  Musculoskeletal: Strength & Muscle Tone: within normal limits Gait & Station: normal Patient leans: N/A  Psychiatric Specialty Exam:See MD SRA Physical Exam  ROS  Blood pressure (!) 134/102, pulse 100, temperature 98.2 F (36.8 C), temperature source Oral, resp. rate 16, height 5\' 7"  (1.702 m), weight 53.1 kg  (117 lb), SpO2 100 %.Body mass index is 18.32 kg/m.     Have you used any form of tobacco in the last 30 days? (Cigarettes, Smokeless Tobacco, Cigars, and/or Pipes): Yes  Has this patient used any form of tobacco in the last 30 days? (Cigarettes, Smokeless Tobacco, Cigars, and/or Pipes) Yes, Yes, A prescription for an FDA-approved tobacco cessation medication was offered at discharge and the patient refused  Blood Alcohol level:  Lab Results  Component Value Date   [facility] <5 37/62/8315    Metabolic Disorder Labs:  Lab Results  Component Value Date   HGBA1C 5.1 07/07/2016   MPG 100 07/07/2016   Lab Results  Component Value Date   PROLACTIN 31.6 (H) 07/07/2016   Lab Results  Component Value Date   CHOL 136 07/07/2016   TRIG 82 07/07/2016   HDL 65 07/07/2016   CHOLHDL 2.1 07/07/2016   VLDL 16 07/07/2016   LDLCALC 55 07/07/2016    See Psychiatric Specialty Exam and Suicide Risk Assessment completed by Attending Physician prior to discharge.  Discharge destination:  Home  Is patient on multiple antipsychotic therapies at discharge:  No   Has Patient had three or more failed trials of antipsychotic monotherapy by history:  No  Recommended Plan for Multiple Antipsychotic Therapies: NA  Discharge Instructions    Discharge instructions    Complete by:  As directed    Please continue to take medications as directed. If your symptoms return, worsen, or persist please call your 911, report to local ER, or contact crisis hotline. Please do not drink alcohol or use any illegal substances while taking prescription medications.     Allergies as of 07/11/2016      Reactions   Antihistamines, Diphenhydramine-type Anxiety, Other (See Comments)   As told by pt and his wife "restless leg syndrome"      Medication List    STOP taking these medications   B-COMPLEX/B-12 PO   chlordiazePOXIDE 5 MG capsule Commonly known as:  LIBRIUM   gabapentin 600 MG tablet Commonly known as:   NEURONTIN Replaced by:  gabapentin 300 MG capsule   ibuprofen 200 MG tablet Commonly known as:  ADVIL,MOTRIN   OLANZapine-FLUoxetine 6-25 MG capsule Commonly known as:  SYMBYAX   OVER THE COUNTER MEDICATION   rOPINIRole 1 MG tablet Commonly known as:  REQUIP   topiramate 25 MG tablet Commonly known as:  TOPAMAX     TAKE these medications     Indication  gabapentin 300 MG capsule Commonly known as:  NEURONTIN Take 1 capsule (300 mg total) by mouth 3 (three) times daily. Replaces:  gabapentin 600 MG tablet  Indication:  anxiety   levothyroxine 25 MCG tablet Commonly known as:  SYNTHROID, LEVOTHROID Take 1 tablet (25 mcg total) by mouth daily before breakfast. Start taking on:  07/12/2016 What changed:  when to take this  Indication:  Underactive Thyroid   multivitamin with minerals Tabs tablet Take 1 tablet by mouth daily.  Indication:  Nutritional Support   OLANZapine zydis 20 MG disintegrating tablet Commonly known as:  ZYPREXA Take 1  tablet (20 mg total) by mouth at bedtime.  Indication:  Manic-Depression   venlafaxine XR 150 MG 24 hr capsule Commonly known as:  EFFEXOR-XR Take 1 capsule (150 mg total) by mouth daily with breakfast. Start taking on:  07/12/2016 What changed:  medication strength  how much to take  when to take this  Indication:  Generalized Anxiety Disorder, Major Depressive Disorder      Follow-up [facility] Follow up on 07/16/2016.   Why:  Appt on Thursday at 11:00AM with Dr. Toy Dean for hospital follow-up/medication management. Thank you.  Contact information: [address] [place], [place] [address] Phone: [phone] Fax: [phone]         Increase activity as toleratedollow-up recommendations:  Activity:  Increase activity as tolerated Diet:  Regular house diet Tests:  Recommend routine testing as directed by outpatient psychiatrist.    Signed: Nanci Pina, FNP 07/11/2016, 10:13  AM

## 2016-07-11 NOTE — BHH Group Notes (Signed)
[facility] Group Notes:  (Clinical Social Work)  07/11/2016  10:00-11:00AM  Summary of Progress/Problems:   Today's process group involved patients discussing their feelings related to being hospitalized and what they would be doing instead if at home.  This led to talking about how to stay well and therefore out of the hospital.  Among the activities listed were taking medications on time, finding hobbies to pursue that provide a source of fun, talking with a professional counselor to vent feelings and figure out solutions to problems, and more.  Patient stated today, if not in the hospital, he would be playing the guitar.  He was very involved in the discussion in a positive way, laughing appropriately at times, interjecting comments and nodding his head to other patients' comments.  He displayed insight, coming up with a number of suggestions that will help him (and others) to stay well and at home.  Type of Therapy:  Group Therapy - Process  Participation Level:  Active  Participation Quality:  Attentive, Sharing and Supportive  Affect:  Blunted  Cognitive:  Appropriate and Oriented  Insight:  Engaged  Engagement in Therapy:  Engaged  Modes of Intervention:  Exploration, Discussion  Selmer Dominion, LCSW 07/11/2016, 12:46 PM

## 2016-07-11 NOTE — BHH Suicide Risk Assessment (Signed)
[facility] Discharge Suicide Risk Assessment   Principal Problem: Substance-induced psychotic disorder with delusions [facility]) Discharge Diagnoses:  Patient Active Problem List   Diagnosis Date Noted  . Substance-induced psychotic disorder with delusions ([place]) [F19.950] 07/05/2016  . GAD (generalized anxiety disorder) [F41.1] 07/05/2016  . Restless leg syndrome [G25.81] 07/05/2016  . MDD (major depressive disorder), recurrent, severe, with psychosis ([place]) [F33.3] 07/05/2016    Total Time spent with patient: 45 minutes  Musculoskeletal: Strength & Muscle Tone: within normal limits Gait & Station: normal Patient leans: N/A  Psychiatric Specialty Exam: Review of Systems  Constitutional: Negative.   HENT: Negative.   Eyes: Negative.   Respiratory: Negative.   Cardiovascular: Negative.   Gastrointestinal: Negative.   Genitourinary: Negative.   Musculoskeletal: Negative.   Skin: Negative.   Neurological: Negative.   Endo/Heme/Allergies: Negative.   Psychiatric/Behavioral: Negative for depression, hallucinations, memory loss, substance abuse and suicidal ideas. The patient is not nervous/anxious and does not have insomnia.     Blood pressure (!) 134/102, pulse 100, temperature 98.2 F (36.8 C), temperature source Oral, resp. rate 16, height 5\' 7"  (1.702 m), weight 53.1 kg (117 lb), SpO2 100 %.Body mass index is 18.32 kg/m.  General Appearance: Neatly dressed, pleasant, engaging well and cooperative. Appropriate behavior. Not in any distress. Good relatedness. Not internally stimulated.  Eye Contact::  Good  Speech:  Spontaneous, normal prosody. Normal tone and rate.   Volume:  Normal  Mood:  Euthymic  Affect:  Appropriate and Full Range  Thought Process:  Goal Directed and Linear  Orientation:  Full (Time, Place, and Person)  Thought Content:  No delusional theme. No preoccupation with violent thoughts. No negative ruminations. No obsession.  No hallucination in any modality.   Suicidal  Thoughts:  No  Homicidal Thoughts:  No  Memory:  Immediate;   Good Recent;   Good Remote;   Good  Judgement:  Good  Insight:  Good  Psychomotor Activity:  Normal  Concentration:  Good  Recall:  Good  Fund of Knowledge:Good  Language: Good  Akathisia:  No  Handed:    AIMS (if indicated):     Assets:  Desire for Improvement Financial Resources/Insurance Housing Intimacy Physical Health Resilience Transportation Vocational/Educational  Sleep:  Number of Hours: 6.5  Cognition: WNL  ADL's:  Intact   Clinical  Assessment: 36 yo Caucasian male, married, employed. Background history of Depression, anxiety and SUD. Patient referred in for admission by his psychiatrist. His wife reports a week history of bizarre behavior. He is stated to have expressed belief that their house and Internet has been hacked. He has been expressing abnormal perceptions. He has become guarded and easily agitated. UDS was positive for amphetamines, THC and benzodiazepines.   Seen today. Feeling good. Says his mind has cleared up. He is now able to process information efficiently. He is able to focus on task. He is able to think clearly. No persecutory or paranoid delusion. Not expressing any other form of delusion. No passivity of thought. No passivity of will. No overwhelming anxiety. No thoughts of suicide. No thoughts of homicide. No thoughts of violence. No evidence of depression. No evidence of mania. He is not craving for substances.   Nursing staff reports that patient has been appropriate on the unit. Patient has been interacting well with peers. No behavioral issues. Patient has not voiced any suicidal thoughts. Patient has not been observed to be internally stimulated. Patient has been adherent with treatment recommendations. Patient has been tolerating their medication well.  Patient was discussed at team. Team members feels that patient is back to his baseline level of function. Team agrees with plan  to discharge patient today.   Demographic Factors:  Male  Loss Factors: NA  Historical Factors: NA  Risk Reduction Factors:   Sense of responsibility to family, Employed, Living with another person, especially a relative, Positive social support, Positive therapeutic relationship and Positive coping skills or problem solving skills  Continued Clinical Symptoms:  As above  Cognitive Features That Contribute To Risk:  None    Suicide Risk:  Minimal: No identifiable suicidal ideation.   Patient is not having any thoughts of suicide at this time. Modifiable risk factors targeted during this admission includes psychosis and substance use. Demographical and historical risk factors cannot be modified. Patient is now engaging well. Patient is reliable and is future oriented. We have buffered patient's support structures. At this point, patient is at low risk of suicide. Patient is aware of the effects of psychoactive substances on decision making process. Patient has been provided with emergency contacts. Patient acknowledges to use resources provided if unforseen circumstances changes their current risk stratification.    Follow-up Information    [facility] Follow up on 07/16/2016.   Why:  Appt on Thursday at 11:00AM with Dr. Toy Care for hospital follow-up/medication management. Thank you.  Contact information: [address] [place], [place] [address] Phone: [phone] Fax: [phone]           Plan Of Care/Follow-up recommendations:  1. Continue current psychotropic medications 2. Mental health and addiction follow up as arranged.  3. Discharge in care of his family   [place], MD 07/11/2016, 10:08 AM

## 2016-07-11 NOTE — Progress Notes (Signed)
Patient to discharged ambulatory to wife in  lobby. All discharge paperwork given and signed, valuables returned. Prescriptions given as well as samples of Zyprexa as patient's pharmacy is not open until Monday - patient has all other medications at home. Patient able to verbalize understanding. Patient stable, denies SI/HI/AVH. Patient given opportunity to express concerns and ask questions.

## 2016-07-13 MED FILL — OLANZapine 20 MG TBDP: 20 | 30 days supply | Qty: 30 | Fill #0

## 2016-07-13 MED FILL — GABAPENTIN 300 MG CAPSULE: 300 | 30 days supply | Qty: 90 | Fill #0

## 2016-07-14 MED FILL — PRAZOSIN 2 MG CAPSULE: 2 | 90 days supply | Qty: 90 | Fill #0

## 2016-07-15 DIAGNOSIS — F22 Delusional disorders: Secondary | ICD-10-CM | POA: Diagnosis not present

## 2016-07-27 MED FILL — VYVANSE 70 MG CAPSULE: 70 | 30 days supply | Qty: 30 | Fill #0

## 2016-07-28 DIAGNOSIS — F4310 Post-traumatic stress disorder, unspecified: Secondary | ICD-10-CM | POA: Diagnosis not present

## 2016-07-30 DIAGNOSIS — F4310 Post-traumatic stress disorder, unspecified: Secondary | ICD-10-CM | POA: Diagnosis not present

## 2016-08-17 DIAGNOSIS — F4310 Post-traumatic stress disorder, unspecified: Secondary | ICD-10-CM | POA: Diagnosis not present

## 2016-08-21 MED FILL — PRAZOSIN 1 MG CAPSULE: 1 | 30 days supply | Qty: 180 | Fill #0

## 2016-08-21 MED FILL — REXULTI 2 MG TABLET: 2 | 30 days supply | Qty: 30 | Fill #0

## 2016-08-25 MED FILL — VYVANSE 70 MG CAPSULE: 70 | 30 days supply | Qty: 30 | Fill #0

## 2016-09-02 DIAGNOSIS — F4310 Post-traumatic stress disorder, unspecified: Secondary | ICD-10-CM | POA: Diagnosis not present

## 2016-09-07 MED FILL — D-AMPHETAMINE ER 15 MG CAP: 15 | 90 days supply | Qty: 90 | Fill #0

## 2016-09-07 MED FILL — REXULTI 3 MG TABLET: 3 | 90 days supply | Qty: 90 | Fill #0

## 2016-09-07 MED FILL — TRINTELLIX 20 MG TABLET: 20 | 90 days supply | Qty: 90 | Fill #0

## 2016-09-11 MED FILL — GABAPENTIN 300 MG CAPSULE: 300 | 90 days supply | Qty: 270 | Fill #0

## 2016-09-14 ENCOUNTER — Ambulatory Visit (INDEPENDENT_AMBULATORY_CARE_PROVIDER_SITE_OTHER): Payer: 59 | Admitting: Psychiatry

## 2016-09-14 ENCOUNTER — Ambulatory Visit
Admission: RE | Admit: 2016-09-14 | Discharge: 2016-09-14 | Disposition: A | Discharge status: Home / Self Care | Payer: 59 | Source: Ambulatory Visit | Attending: Psychiatry | Admitting: Psychiatry

## 2016-09-14 ENCOUNTER — Encounter: Payer: Self-pay | Admitting: Psychiatry

## 2016-09-14 ENCOUNTER — Encounter
Admission: RE | Admit: 2016-09-14 | Discharge: 2016-09-14 | Disposition: A | Discharge status: Home / Self Care | Payer: 59 | Source: Ambulatory Visit | Attending: Psychiatry | Admitting: Psychiatry

## 2016-09-14 VITALS — BP 156/101 | HR 74 | Temp 98.7°F | Wt 153.8 lb

## 2016-09-14 DIAGNOSIS — R918 Other nonspecific abnormal finding of lung field: Secondary | ICD-10-CM | POA: Insufficient documentation

## 2016-09-14 DIAGNOSIS — Z01818 Encounter for other preprocedural examination: Secondary | ICD-10-CM | POA: Diagnosis present

## 2016-09-14 DIAGNOSIS — I10 Essential (primary) hypertension: Secondary | ICD-10-CM

## 2016-09-14 DIAGNOSIS — F322 Major depressive disorder, single episode, severe without psychotic features: Secondary | ICD-10-CM | POA: Diagnosis not present

## 2016-09-14 DIAGNOSIS — F332 Major depressive disorder, recurrent severe without psychotic features: Secondary | ICD-10-CM

## 2016-09-14 DIAGNOSIS — Z01812 Encounter for preprocedural laboratory examination: Secondary | ICD-10-CM | POA: Diagnosis not present

## 2016-09-14 DIAGNOSIS — Z01810 Encounter for preprocedural cardiovascular examination: Secondary | ICD-10-CM | POA: Diagnosis not present

## 2016-09-14 HISTORY — DX: Unspecified asthma, uncomplicated: J45.909

## 2016-09-14 HISTORY — DX: Essential (primary) hypertension: I10

## 2016-09-14 LAB — CBC
HCT: 39.3 % — ABNORMAL LOW (ref 40.0–52.0)
Hemoglobin: 13.4 g/dL (ref 13.0–18.0)
MCH: 33 pg (ref 26.0–34.0)
MCHC: 34.1 g/dL (ref 32.0–36.0)
MCV: 96.7 fL (ref 80.0–100.0)
PLATELETS: 181 10*3/uL (ref 150–440)
RBC: 4.07 MIL/uL — AB (ref 4.40–5.90)
RDW: 13.3 % (ref 11.5–14.5)
WBC: 7.5 10*3/uL (ref 3.8–10.6)

## 2016-09-14 LAB — URINALYSIS, COMPLETE (UACMP) WITH MICROSCOPIC
Bacteria, UA: NONE SEEN
Bilirubin Urine: NEGATIVE
GLUCOSE, UA: NEGATIVE mg/dL
HGB URINE DIPSTICK: NEGATIVE
KETONES UR: NEGATIVE mg/dL
LEUKOCYTES UA: NEGATIVE
NITRITE: NEGATIVE
PH: 7 (ref 5.0–8.0)
PROTEIN: NEGATIVE mg/dL
RBC / HPF: NONE SEEN RBC/hpf (ref 0–5)
Specific Gravity, Urine: 1.004 — ABNORMAL LOW (ref 1.005–1.030)
WBC, UA: NONE SEEN WBC/hpf (ref 0–5)

## 2016-09-14 LAB — BASIC METABOLIC PANEL
ANION GAP: 4 — AB (ref 5–15)
BUN: 12 mg/dL (ref 6–20)
CALCIUM: 8.9 mg/dL (ref 8.9–10.3)
CO2: 28 mmol/L (ref 22–32)
Chloride: 106 mmol/L (ref 101–111)
Creatinine, Ser: 0.74 mg/dL (ref 0.61–1.24)
Glucose, Bld: 81 mg/dL (ref 65–99)
Potassium: 4 mmol/L (ref 3.5–5.1)
SODIUM: 138 mmol/L (ref 135–145)

## 2016-09-14 NOTE — Progress Notes (Signed)
ECT: This is an ECT consult an intake for this 36 year old man with a history of severe depression. Patient was referred by his outpatient psychiatrist. He was present at the appointment with his wife as well. Patient reports that he has had depressive symptoms since age 81. Also has been diagnosed with ADHD. Currently his mood feels flat and negative most of the time. He feels like he has no energy and no motivation and no interest in anything. He feels excessively tired. Sleep somewhat poorly at night. Appetite not so good. Patient and wife both describe him getting more agitated and angry as the day goes on as well. He has passive suicidal thoughts currently without any intent or plan. Currently not reporting any hallucinations. He is currently taking antidepressant and antipsychotic medicine prescribed by his outpatient physician. He has been unable to go back to work since April and feels severely disabled. No single specific stressor. Patient also still reports that he is currently having paranoia. Denies active hallucinations but says that he still often has intrusive thoughts that people are out to get him or trying to hurt him  Medical history: No history of COPD no history of heart disease or high blood pressure. Past history of hypothyroidism currently corrected. Patient has a history of obesity in the past and has had major weight swings over the years.  Social history: Patient is married lives with his wife. He had previously been working until April but has not been able to go back to work since then because of his severe depression.  Substance abuse history: History of routine use of marijuana for years. Felt that marijuana was helpful for his mood. Says he is currently not using and has not had marijuana since April.  Past psychiatric history: Patient has had a previous suicide attempt in 2005. He has had psychiatric hospitalizations and has been on a wide range of medicines. Medications  include multiple antidepressants, antipsychotics, mood stabilizers and antianxiety medicines. Patient reports that Zyprexa was very helpful but caused a large amount of weight gain which was very disturbing to the patient. Latuda also was helpful but unfortunately was not covered by his insurance. Unclear whether he has had a true mania although it sounds like mostly his history has been of negative mood and anxiety. Patient also has been diagnosed with ADHD and has been on relatively high doses of stimulants which she appears to tolerate fairly well and he says help him with focus and concentration at least.  Mental status exam: Neatly dressed and groomed man looks his stated age. Patient is cooperative with the interview. Eye contact good. Psychomotor activity limited. Affect blunted. Mood stated as being bad. Thoughts are lucid but slow. No obvious delusional or bizarre thinking. Has some insight into his paranoia. Has suicidal thoughts without any intent or plan to act on it. No homicidal ideation. Patient is alert and oriented 4 appears to have intact short and long-term memory. He is able to make decisions about his medical care and understand treatment plans.  Patient has a positive family history of mental illness with both his father and grandfather having committed suicide and one uncle possibly with schizophrenia.  This is a 36 year old man with a history of long-standing mood disorder probably recurrent severe psychotic depression. Also history of severe anxiety symptoms. Patient is very impaired and unable to function in his current condition and has not been able to find a medicine that was both effective and tolerable. He is currently on stimulants  which have been used to treat his ADHD. Patient appears to be a reasonably good candidate for ECT based on his diagnosis, nonresponse to previous medications, severe impairment. He has few relative contraindications. Good physical health. He has had  general anesthesia in the past and tolerated it without difficulty.  Patient was presented with the option of pursuing ECT treatment. Treatment was described in detail including risks and benefits and potential side effects. Patient and wife were given ample opportunity to ask questions. Patient states a tentative agreement to pursuing ECT treatment. Because of our scheduling at this point the earliest we could start would probably be July 6. Patient already has an appointment in place to go to the preadmission testing office today. Case reviewed with ECT nursing. Patient at this point is tentatively scheduled for ECT starting July 6. I reviewed his medicines and although the gabapentin is an anticonvulsant he says it has been very helpful with his anxiety. Because of the relatively low dose in the week efficacy of this medicine for seizure prevention I'm not going to tell him to stop it for now. Seems goes for the low dose of Librium.

## 2016-09-17 MED FILL — LEVOTHYROXINE 25 MCG TABLET: 25 | 90 days supply | Qty: 90 | Fill #2

## 2016-09-21 MED FILL — LATUDA 20 MG TABLET: 20 | 30 days supply | Qty: 30 | Fill #0

## 2016-09-22 MED FILL — PRAZOSIN 1 MG CAPSULE: 1 | 90 days supply | Qty: 540 | Fill #0

## 2016-09-22 MED FILL — VYVANSE 70 MG CAPSULE: 70 | 90 days supply | Qty: 90 | Fill #0

## 2016-09-28 ENCOUNTER — Telehealth: Payer: Self-pay

## 2016-10-01 ENCOUNTER — Other Ambulatory Visit: Payer: Self-pay | Admitting: Psychiatry

## 2016-10-02 ENCOUNTER — Encounter: Payer: Self-pay | Admitting: Anesthesiology

## 2016-10-02 ENCOUNTER — Encounter
Admission: RE | Admit: 2016-10-02 | Discharge: 2016-10-02 | Disposition: A | Discharge status: Home / Self Care | Payer: 59 | Source: Ambulatory Visit | Attending: Psychiatry | Admitting: Psychiatry

## 2016-10-02 ENCOUNTER — Other Ambulatory Visit: Payer: Self-pay | Admitting: Psychiatry

## 2016-10-02 DIAGNOSIS — Z818 Family history of other mental and behavioral disorders: Secondary | ICD-10-CM | POA: Insufficient documentation

## 2016-10-02 DIAGNOSIS — F332 Major depressive disorder, recurrent severe without psychotic features: Secondary | ICD-10-CM | POA: Diagnosis not present

## 2016-10-02 DIAGNOSIS — E039 Hypothyroidism, unspecified: Secondary | ICD-10-CM | POA: Insufficient documentation

## 2016-10-02 DIAGNOSIS — R45851 Suicidal ideations: Secondary | ICD-10-CM | POA: Insufficient documentation

## 2016-10-02 DIAGNOSIS — I10 Essential (primary) hypertension: Secondary | ICD-10-CM | POA: Insufficient documentation

## 2016-10-02 DIAGNOSIS — F17210 Nicotine dependence, cigarettes, uncomplicated: Secondary | ICD-10-CM | POA: Insufficient documentation

## 2016-10-02 DIAGNOSIS — Z9889 Other specified postprocedural states: Secondary | ICD-10-CM | POA: Diagnosis not present

## 2016-10-02 DIAGNOSIS — Z833 Family history of diabetes mellitus: Secondary | ICD-10-CM | POA: Insufficient documentation

## 2016-10-02 DIAGNOSIS — F3189 Other bipolar disorder: Secondary | ICD-10-CM | POA: Diagnosis not present

## 2016-10-02 DIAGNOSIS — Z888 Allergy status to other drugs, medicaments and biological substances status: Secondary | ICD-10-CM | POA: Insufficient documentation

## 2016-10-02 DIAGNOSIS — F339 Major depressive disorder, recurrent, unspecified: Secondary | ICD-10-CM | POA: Diagnosis not present

## 2016-10-02 DIAGNOSIS — J45909 Unspecified asthma, uncomplicated: Secondary | ICD-10-CM | POA: Insufficient documentation

## 2016-10-02 DIAGNOSIS — F2089 Other schizophrenia: Secondary | ICD-10-CM | POA: Diagnosis not present

## 2016-10-02 MED ORDER — SUCCINYLCHOLINE CHLORIDE 200 MG/10ML IV SOSY
PREFILLED_SYRINGE | INTRAVENOUS | Status: DC | PRN
  Administered 2016-10-02: 100 mg via INTRAVENOUS

## 2016-10-02 MED ORDER — SODIUM CHLORIDE 0.9 % IV SOLN
INTRAVENOUS | Status: DC | PRN
  Administered 2016-10-02: 10:00:00 via INTRAVENOUS

## 2016-10-02 MED ORDER — SODIUM CHLORIDE 0.9 % IV SOLN
500.0000 mL | Freq: Once | INTRAVENOUS | Status: AC
  Administered 2016-10-02: 09:00:00 via INTRAVENOUS

## 2016-10-02 MED ORDER — MIDAZOLAM HCL 2 MG/2ML IJ SOLN
INTRAMUSCULAR | Status: DC | PRN
  Administered 2016-10-02: 2 mg via INTRAVENOUS

## 2016-10-02 MED ORDER — MIDAZOLAM HCL 2 MG/2ML IJ SOLN
INTRAMUSCULAR | Status: AC
  Filled 2016-10-02: qty 2

## 2016-10-02 MED ORDER — METHOHEXITAL SODIUM 100 MG/10ML IV SOSY
PREFILLED_SYRINGE | INTRAVENOUS | Status: DC | PRN
  Administered 2016-10-02: 70 mg via INTRAVENOUS

## 2016-10-02 MED ORDER — KETOROLAC TROMETHAMINE 30 MG/ML IJ SOLN
INTRAMUSCULAR | Status: AC
  Administered 2016-10-02: 30 mg via INTRAVENOUS
  Filled 2016-10-02: qty 1

## 2016-10-02 MED ORDER — KETOROLAC TROMETHAMINE 30 MG/ML IJ SOLN
30.0000 mg | Freq: Once | INTRAMUSCULAR | Status: AC
  Administered 2016-10-02: 30 mg via INTRAVENOUS

## 2016-10-02 NOTE — Anesthesia Post-op Follow-up Note (Cosign Needed)
Anesthesia QCDR form completed.        

## 2016-10-02 NOTE — Procedures (Signed)
ECT SERVICES Physician's Interval Evaluation & Treatment Note  Patient Identification: Curby Carswell III MRN:  [id number] Date of Evaluation:  10/02/2016 TX #: 1  MADRS: 32  MMSE: 30  P.E. Findings:  Heart and lungs normal. Vital signs unremarkable. No significant findings  Psychiatric Interval Note:  Chronic depression and passive suicidal thoughts under good control no intention or plan and no evidence of psychosis. Blunted affect.  Subjective:  Patient is a 36 y.o. male seen for evaluation for Electroconvulsive Therapy. Chronic depression  Treatment Summary:   [x]   Right Unilateral             []  Bilateral   % Energy : 0.3 ms 40%   Impedance: 1420 ohms  Seizure Energy Index: 17,565 V squared  Postictal Suppression Index: 78%  Seizure Concordance Index: 98%  Medications  Pre Shock: Toradol 30 mg Brevital 70 mg succinylcholine 100 mg  Post Shock:    Seizure Duration: 56 seconds by observation of movement, 76 seconds by EEG   Comments: Follow-up 3 times a week Monday Wednesday and Friday into next week for continued index course   Lungs:  []   Clear to auscultation               []  Other:   Heart:    [x]   Regular rhythm             []  irregular rhythm    [x]   Previous H&P reviewed, patient examined and there are NO CHANGES                 []   Previous H&P reviewed, patient examined and there are changes noted.   Alethia Berthold, MD 7/6/201810:59 AM

## 2016-10-02 NOTE — Anesthesia Postprocedure Evaluation (Signed)
Anesthesia Post Note  Patient: Zachary Dean  Procedure(s) Performed: * No procedures listed *  Patient location during evaluation: PACU Anesthesia Type: General Level of consciousness: awake and alert Pain management: pain level controlled Vital Signs Assessment: post-procedure vital signs reviewed and stable Respiratory status: spontaneous breathing, nonlabored ventilation, respiratory function stable and patient connected to nasal cannula oxygen Cardiovascular status: blood pressure returned to baseline and stable Postop Assessment: no signs of nausea or vomiting Anesthetic complications: no     Last Vitals:  Vitals:   10/02/16 1148 10/02/16 1222  BP: 132/78 (!) 134/91  Pulse: 89   Resp: 16   Temp: 36.9 C     Last Pain:  Vitals:   10/02/16 1140  TempSrc:   PainSc: 0-No pain                 Cereniti Curb S

## 2016-10-02 NOTE — H&P (Signed)
Zachary Dean is an 36 y.o. male.   Chief Complaint: 36 year old man with a history of recurrent depression unresponsive to medicine. Passive suicidal thoughts at times. Poor response to medicine. HPI: We will be starting an index course of right unilateral ECT treatment beginning today. Scheduled Monday and Wednesday and Friday through next week and presumably into the following week  Past Medical History:  Diagnosis Date  . Anxiety   . Asthma    childhood asthma  . Bipolar disorder ([place])   . Depression   . Hypertension   . Hypothyroid   . Thyroid disease     Past Surgical History:  Procedure Laterality Date  . TONSILLECTOMY     Age 57 years old    Family History  Problem Relation Age of Onset  . Cancer Father   . Diabetes Father   . Anxiety disorder Mother   . Depression Mother   . Anxiety disorder Sister   . Depression Sister    Social History:  reports that he has been smoking Cigarettes.  He has a 5.00 pack-year smoking history. He has never used smokeless tobacco. He reports that he does not drink alcohol or use drugs.  Allergies:  Allergies  Allergen Reactions  . Antihistamines, Diphenhydramine-Type Anxiety and Other (See Comments)    As told by pt and his wife "restless leg syndrome"     (Not in a hospital admission)  No results found for this or any previous visit (from the past 48 hour(s)). No results found.  Review of Systems  Constitutional: Negative.   HENT: Negative.   Eyes: Negative.   Respiratory: Negative.   Cardiovascular: Negative.   Gastrointestinal: Negative.   Musculoskeletal: Negative.   Skin: Negative.   Neurological: Negative.   Psychiatric/Behavioral: Positive for depression. Negative for hallucinations, memory loss, substance abuse and suicidal ideas. The patient is nervous/anxious and has insomnia.     Blood pressure 122/83, pulse 65, temperature 98.7 F (37.1 C), temperature source Oral, resp. rate 16, height 5\' 7"   (1.702 m), weight 69.9 kg (154 lb), SpO2 100 %. Physical Exam  Nursing note and vitals reviewed. Constitutional: He appears well-developed and well-nourished.  HENT:  Head: Normocephalic and atraumatic.  Eyes: Conjunctivae are normal. Pupils are equal, round, and reactive to light.  Neck: Normal range of motion.  Cardiovascular: Regular rhythm and normal heart sounds.   Respiratory: Effort normal. No respiratory distress.  GI: Soft.  Musculoskeletal: Normal range of motion.  Neurological: He is alert.  Skin: Skin is warm and dry.  Psychiatric: Judgment normal. His mood appears anxious. His speech is delayed. He is slowed. He exhibits a depressed mood. He expresses no suicidal ideation. He exhibits normal recent memory.     Assessment/Plan Patient is physically stable without any physical problems that would contraindicate ACT. Anesthesia consult completed. Reviewed with nursing. Begin treatment today.  Alethia Berthold, MD 10/02/2016, 10:57 AM

## 2016-10-02 NOTE — Anesthesia Procedure Notes (Signed)
Date/Time: 10/02/2016 11:05 AM Performed by: Dionne Bucy Pre-anesthesia Checklist: Patient identified, Emergency Drugs available, Suction available and Patient being monitored Patient Re-evaluated:Patient Re-evaluated prior to inductionOxygen Delivery Method: Circle system utilized Preoxygenation: Pre-oxygenation with 100% oxygen Intubation Type: IV induction Ventilation: Mask ventilation without difficulty and Mask ventilation throughout procedure Airway Equipment and Method: Bite block Placement Confirmation: positive ETCO2 Dental Injury: Teeth and Oropharynx as per pre-operative assessment

## 2016-10-02 NOTE — Anesthesia Preprocedure Evaluation (Signed)
Anesthesia Evaluation  Patient identified by MRN, date of birth, ID band Patient awake    Reviewed: Allergy & Precautions, NPO status , Patient's Chart, lab work & pertinent test results, reviewed documented beta blocker date and time   Airway Mallampati: II  TM Distance: >3 FB     Dental  (+) Chipped   Pulmonary asthma , Current Smoker,           Cardiovascular hypertension, Pt. on medications      Neuro/Psych PSYCHIATRIC DISORDERS Anxiety Depression Bipolar Disorder Schizophrenia    GI/Hepatic   Endo/Other  Hypothyroidism   Renal/GU      Musculoskeletal   Abdominal   Peds  Hematology   Anesthesia Other Findings   Reproductive/Obstetrics                             Anesthesia Physical Anesthesia Plan  ASA: III  Anesthesia Plan: General   Post-op Pain Management:    Induction: Intravenous  PONV Risk Score and Plan:   Airway Management Planned: Oral ETT  Additional Equipment:   Intra-op Plan:   Post-operative Plan:   Informed Consent: I have reviewed the patients History and Physical, chart, labs and discussed the procedure including the risks, benefits and alternatives for the proposed anesthesia with the patient or authorized representative who has indicated his/her understanding and acceptance.     Plan Discussed with: CRNA  Anesthesia Plan Comments:         Anesthesia Quick Evaluation

## 2016-10-02 NOTE — Transfer of Care (Signed)
Immediate Anesthesia Transfer of Care Note  Patient: Zachary Dean  Procedure(s) Performed: ECT  Patient Location: PACU  Anesthesia Type:General  Level of Consciousness: sedated  Airway & Oxygen Therapy: Patient Spontanous Breathing  Post-op Assessment: Report given to RN and Post -op Vital signs reviewed and stable  Post vital signs: Reviewed  Last Vitals:  Vitals:   10/02/16 0832 10/02/16 1118  BP: 122/83 (!) 150/75  Pulse: 65 84  Resp: 16 16  Temp: 37.1 C 37.1 C    Last Pain:  Vitals:   10/02/16 0832  TempSrc: Oral         Complications: No apparent anesthesia complications

## 2016-10-02 NOTE — Discharge Instructions (Signed)
1)  The drugs that you have been given will stay in your system until tomorrow so for the       next 24 hours you should not:  A. Drive an automobile  B. Make any legal decisions  C. Drink any alcoholic beverages  2)  You may resume your regular meals upon return home.  3)  A responsible adult must take you home.  Someone should stay with you for a few          hours, then be available by phone for the remainder of the treatment day.  4)  You May experience any of the following symptoms:  Headache, Nausea and a dry mouth (due to the medications you were given),  temporary memory loss and some confusion, or sore muscles (a warm bath  should help this).  If you you experience any of these symptoms let us know on                your return visit.  5)  Report any of the following: any acute discomfort, severe headache, or temperature        greater than 100.5 F.   Also report any unusual redness, swelling, drainage, or pain         at your IV site.    You may report Symptoms to:  [place] at [facility]          Phone: [phone], ECT Department           or Dr. Prescott Gum office [phone]  6)  Your next ECT Treatment is Day Monday  Date October 05 2016  We will call 2 days prior to your scheduled appointment for arrival times.  7)  Nothing to eat or drink after midnight the night before your procedure.  8)  Take .   With a sip of water the morning of your procedure.  9)  Other Instructions: Call [phone] to cancel the morning of your procedure due         to illness or emergency.  10) We will call within 72 hours to assess how you are feeling.

## 2016-10-05 ENCOUNTER — Encounter: Payer: Self-pay | Admitting: Anesthesiology

## 2016-10-05 ENCOUNTER — Encounter (HOSPITAL_BASED_OUTPATIENT_CLINIC_OR_DEPARTMENT_OTHER)
Admission: RE | Admit: 2016-10-05 | Discharge: 2016-10-05 | Disposition: A | Discharge status: Home / Self Care | Payer: 59 | Source: Ambulatory Visit | Attending: Psychiatry | Admitting: Psychiatry

## 2016-10-05 ENCOUNTER — Other Ambulatory Visit: Payer: Self-pay | Admitting: Psychiatry

## 2016-10-05 DIAGNOSIS — Z9889 Other specified postprocedural states: Secondary | ICD-10-CM | POA: Diagnosis not present

## 2016-10-05 DIAGNOSIS — Z818 Family history of other mental and behavioral disorders: Secondary | ICD-10-CM | POA: Diagnosis not present

## 2016-10-05 DIAGNOSIS — F339 Major depressive disorder, recurrent, unspecified: Secondary | ICD-10-CM | POA: Diagnosis not present

## 2016-10-05 DIAGNOSIS — R45851 Suicidal ideations: Secondary | ICD-10-CM | POA: Diagnosis not present

## 2016-10-05 DIAGNOSIS — F2089 Other schizophrenia: Secondary | ICD-10-CM | POA: Diagnosis not present

## 2016-10-05 DIAGNOSIS — F3130 Bipolar disorder, current episode depressed, mild or moderate severity, unspecified: Secondary | ICD-10-CM

## 2016-10-05 DIAGNOSIS — F3189 Other bipolar disorder: Secondary | ICD-10-CM | POA: Diagnosis not present

## 2016-10-05 DIAGNOSIS — Z833 Family history of diabetes mellitus: Secondary | ICD-10-CM | POA: Diagnosis not present

## 2016-10-05 DIAGNOSIS — E039 Hypothyroidism, unspecified: Secondary | ICD-10-CM | POA: Diagnosis not present

## 2016-10-05 DIAGNOSIS — J45909 Unspecified asthma, uncomplicated: Secondary | ICD-10-CM | POA: Diagnosis not present

## 2016-10-05 DIAGNOSIS — F17210 Nicotine dependence, cigarettes, uncomplicated: Secondary | ICD-10-CM | POA: Diagnosis not present

## 2016-10-05 DIAGNOSIS — F332 Major depressive disorder, recurrent severe without psychotic features: Secondary | ICD-10-CM | POA: Diagnosis not present

## 2016-10-05 DIAGNOSIS — I10 Essential (primary) hypertension: Secondary | ICD-10-CM | POA: Diagnosis not present

## 2016-10-05 MED ORDER — MIDAZOLAM HCL 2 MG/2ML IJ SOLN
INTRAMUSCULAR | Status: AC
  Filled 2016-10-05: qty 2

## 2016-10-05 MED ORDER — FENTANYL CITRATE (PF) 100 MCG/2ML IJ SOLN: 25.0000 ug | INTRAMUSCULAR | Status: DC | PRN

## 2016-10-05 MED ORDER — SODIUM CHLORIDE 0.9 % IV SOLN
INTRAVENOUS | Status: DC | PRN
  Administered 2016-10-05: 10:00:00 via INTRAVENOUS

## 2016-10-05 MED ORDER — OXYCODONE HCL 5 MG PO TABS: 5.0000 mg | ORAL_TABLET | Freq: Once | ORAL | Status: DC | PRN

## 2016-10-05 MED ORDER — MIDAZOLAM HCL 2 MG/2ML IJ SOLN
INTRAMUSCULAR | Status: DC | PRN
  Administered 2016-10-05: 2 mg via INTRAVENOUS

## 2016-10-05 MED ORDER — SUCCINYLCHOLINE CHLORIDE 20 MG/ML IJ SOLN
INTRAMUSCULAR | Status: AC
  Filled 2016-10-05: qty 1

## 2016-10-05 MED ORDER — OXYCODONE HCL 5 MG/5ML PO SOLN: 5.0000 mg | Freq: Once | ORAL | Status: DC | PRN

## 2016-10-05 MED ORDER — SODIUM CHLORIDE 0.9 % IV SOLN
500.0000 mL | Freq: Once | INTRAVENOUS | Status: AC
  Administered 2016-10-05: 500 mL via INTRAVENOUS

## 2016-10-05 MED ORDER — KETOROLAC TROMETHAMINE 30 MG/ML IJ SOLN
INTRAMUSCULAR | Status: AC
  Administered 2016-10-05: 30 mg via INTRAVENOUS
  Filled 2016-10-05: qty 1

## 2016-10-05 MED ORDER — METHOHEXITAL SODIUM 100 MG/10ML IV SOSY
PREFILLED_SYRINGE | INTRAVENOUS | Status: DC | PRN
  Administered 2016-10-05: 70 mg via INTRAVENOUS

## 2016-10-05 MED ORDER — KETOROLAC TROMETHAMINE 30 MG/ML IJ SOLN
30.0000 mg | Freq: Once | INTRAMUSCULAR | Status: AC
  Administered 2016-10-05: 30 mg via INTRAVENOUS

## 2016-10-05 MED ORDER — SUCCINYLCHOLINE CHLORIDE 20 MG/ML IJ SOLN
INTRAMUSCULAR | Status: DC | PRN
  Administered 2016-10-05: 100 mg via INTRAVENOUS

## 2016-10-05 NOTE — Progress Notes (Signed)
38 Pts wife noted petechiae on pt's lower right arm at site of cuff.  Dr Weber Cooks in to see pt.  Informed wife she could apply hydrocortisone on site if needed.

## 2016-10-05 NOTE — Anesthesia Postprocedure Evaluation (Signed)
Anesthesia Post Note  Patient: Zachary Dean  Procedure(s) Performed: * No procedures listed *  Patient location during evaluation: PACU Anesthesia Type: General Level of consciousness: awake and alert Pain management: pain level controlled Vital Signs Assessment: post-procedure vital signs reviewed and stable Respiratory status: spontaneous breathing, nonlabored ventilation, respiratory function stable and patient connected to nasal cannula oxygen Cardiovascular status: blood pressure returned to baseline and stable Postop Assessment: no signs of nausea or vomiting Anesthetic complications: no     Last Vitals:  Vitals:   10/05/16 1131 10/05/16 1134  BP:  128/78  Pulse: 74   Resp: 16   Temp:      Last Pain:  Vitals:   10/05/16 1048  TempSrc: Temporal                 Precious Haws Takerra Lupinacci

## 2016-10-05 NOTE — Discharge Instructions (Signed)
1)  The drugs that you have been given will stay in your system until tomorrow so for the       next 24 hours you should not:  A. Drive an automobile  B. Make any legal decisions  C. Drink any alcoholic beverages  2)  You may resume your regular meals upon return home.  3)  A responsible adult must take you home.  Someone should stay with you for a few          hours, then be available by phone for the remainder of the treatment day.  4)  You May experience any of the following symptoms:  Headache, Nausea and a dry mouth (due to the medications you were given),  temporary memory loss and some confusion, or sore muscles (a warm bath  should help this).  If you you experience any of these symptoms let us know on                your return visit.  5)  Report any of the following: any acute discomfort, severe headache, or temperature        greater than 100.5 F.   Also report any unusual redness, swelling, drainage, or pain         at your IV site.    You may report Symptoms to:  [place] at [facility]          Phone: [phone], ECT Department           or Dr. Prescott Gum office [phone]  6)  Your next ECT Treatment is Day Wednesday  Date July 11,2018 at 830am  We will call 2 days prior to your scheduled appointment for arrival times.  7)  Nothing to eat or drink after midnight the night before your procedure.  8)  Take .   With a sip of water the morning of your procedure.  9)  Other Instructions: Call [phone] to cancel the morning of your procedure due         to illness or emergency.  10) We will call within 72 hours to assess how you are feeling.

## 2016-10-05 NOTE — Transfer of Care (Signed)
Immediate Anesthesia Transfer of Care Note  Patient: Zachary Dean  Procedure(s) Performed: * No procedures listed *  Patient Location: PACU  Anesthesia Type:General  Level of Consciousness: sedated  Airway & Oxygen Therapy: Patient Spontanous Breathing and Patient connected to face mask oxygen  Post-op Assessment: Report given to RN and Post -op Vital signs reviewed and stable  Post vital signs: Reviewed and stable  Last Vitals:  Vitals:   10/05/16 0856 10/05/16 1048  BP: 117/85 130/82  Pulse: 78   Resp: 18   Temp: 36.5 C 15.8 C    Complications: No apparent anesthesia complications

## 2016-10-05 NOTE — Anesthesia Post-op Follow-up Note (Cosign Needed)
Anesthesia QCDR form completed.        

## 2016-10-05 NOTE — Anesthesia Procedure Notes (Signed)
Date/Time: 10/05/2016 10:34 AM Performed by: Doreen Salvage Pre-anesthesia Checklist: Patient identified, Emergency Drugs available, Suction available and Patient being monitored Patient Re-evaluated:Patient Re-evaluated prior to inductionOxygen Delivery Method: Circle system utilized Preoxygenation: Pre-oxygenation with 100% oxygen Intubation Type: IV induction Ventilation: Mask ventilation without difficulty and Mask ventilation throughout procedure Airway Equipment and Method: Bite block Placement Confirmation: positive ETCO2 Dental Injury: Teeth and Oropharynx as per pre-operative assessment

## 2016-10-05 NOTE — Procedures (Signed)
ECT SERVICES Physician's Interval Evaluation & Treatment Note  Patient Identification: Zachary Dean MRN:  [id number] Date of Evaluation:  10/05/2016 TX #: 2  MADRS:   MMSE:   P.E. Findings:  No change to physical exam. Heart and lungs normal. Vitals unremarkable  Psychiatric Interval Note:  Affect still blunted mood depressed  Subjective:  Patient is a 36 y.o. male seen for evaluation for Electroconvulsive Therapy. No specific complaints other than the ongoing depression   Treatment Summary:   [x]   Right Unilateral             []  Bilateral   % Energy : 0.3 ms 40%   Impedance: 1260 ohms  Seizure Energy Index: 18,019 V squared  Postictal Suppression Index: 60%  Seizure Concordance Index: 99%  Medications  Pre Shock: Toradol 30 mg Brevital 70 mg succinylcholine 100 mg   Post Shock: Versed 2 mg  Seizure Duration: 55 seconds by EMG, 55 seconds by EEG   Comments: Continue Monday Wednesday and Friday treatment for full index course   Lungs:  [x]   Clear to auscultation               []  Other:   Heart:    [x]   Regular rhythm             []  irregular rhythm    [x]   Previous H&P reviewed, patient examined and there are NO CHANGES                 []   Previous H&P reviewed, patient examined and there are changes noted.   Alethia Berthold, MD 7/9/201810:32 AM

## 2016-10-05 NOTE — H&P (Signed)
Zachary Dean is an 36 y.o. male.   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 No new complaints as of today HPI: This is treatment #24 episode of depression. Did not have significant problems after first treatment. No complaints of pain or nausea  Past Medical History:  Diagnosis Date  . Anxiety   . Asthma    childhood asthma  . Bipolar disorder ([place])   . Depression   . Hypertension   . Hypothyroid   . Thyroid disease     Past Surgical History:  Procedure Laterality Date  . TONSILLECTOMY     Age 68 years old    Family History  Problem Relation Age of Onset  . Cancer Father   . Diabetes Father   . Anxiety disorder Mother   . Depression Mother   . Anxiety disorder Sister   . Depression Sister    Social History:  reports that he has been smoking Cigarettes.  He has a 5.00 pack-year smoking history. He has never used smokeless tobacco. He reports that he does not drink alcohol or use drugs.  Allergies:  Allergies  Allergen Reactions  . Antihistamines, Diphenhydramine-Type Anxiety and Other (See Comments)    As told by pt and his wife "restless leg syndrome"     (Not in a hospital admission)  No results found for this or any previous visit (from the past 48 hour(s)). No results found.  Review of Systems  Constitutional: Negative.   HENT: Negative.   Eyes: Negative.   Respiratory: Negative.   Cardiovascular: Negative.   Gastrointestinal: Negative.   Musculoskeletal: Negative.   Skin: Negative.   Neurological: Negative.   Psychiatric/Behavioral: Positive for depression. Negative for hallucinations, memory loss, substance abuse and suicidal ideas. The patient is nervous/anxious. The patient does not have insomnia.     Blood pressure 117/85, pulse 78, temperature 97.7 F (36.5 C), temperature source Oral, resp. rate 18, height 5\' 7"  (1.702 m), weight 68.9 kg (152 lb), SpO2 100 %. Physical Exam  Nursing  note and vitals reviewed. Constitutional: He appears well-developed and well-nourished.  HENT:  Head: Normocephalic and atraumatic.  Eyes: Conjunctivae are normal. Pupils are equal, round, and reactive to light.  Neck: Normal range of motion.  Cardiovascular: Regular rhythm and normal heart sounds.   Respiratory: Effort normal. No respiratory distress.  GI: Soft.  Musculoskeletal: Normal range of motion.  Neurological: He is alert.  Skin: Skin is warm and dry.  Psychiatric: Judgment normal. His affect is blunt. His speech is delayed. He is slowed and withdrawn. Cognition and memory are normal. He exhibits a depressed mood. He expresses no suicidal ideation.     Assessment/Plan Follow-up Wednesday Friday and in through the following week for full index course  Alethia Berthold, MD 10/05/2016, 10:30 AM

## 2016-10-05 NOTE — Anesthesia Preprocedure Evaluation (Signed)
Anesthesia Evaluation  Patient identified by MRN, date of birth, ID band Patient awake    Reviewed: Allergy & Precautions, NPO status , Patient's Chart, lab work & pertinent test results, reviewed documented beta blocker date and time   Airway Mallampati: II  TM Distance: >3 FB     Dental  (+) Chipped, Poor Dentition   Pulmonary asthma , Current Smoker,           Cardiovascular hypertension, Pt. on medications      Neuro/Psych PSYCHIATRIC DISORDERS Anxiety Depression Bipolar Disorder Schizophrenia    GI/Hepatic   Endo/Other  Hypothyroidism   Renal/GU      Musculoskeletal   Abdominal   Peds  Hematology   Anesthesia Other Findings Past Medical History: No date: Anxiety No date: Asthma     Comment: childhood asthma No date: Bipolar disorder ([place]) No date: Depression No date: Hypertension No date: Hypothyroid No date: Thyroid disease   Reproductive/Obstetrics                             Anesthesia Physical  Anesthesia Plan  ASA: III  Anesthesia Plan: General   Post-op Pain Management:    Induction: Intravenous  PONV Risk Score and Plan:   Airway Management Planned: Oral ETT  Additional Equipment:   Intra-op Plan:   Post-operative Plan:   Informed Consent: I have reviewed the patients History and Physical, chart, labs and discussed the procedure including the risks, benefits and alternatives for the proposed anesthesia with the patient or authorized representative who has indicated his/her understanding and acceptance.   Dental Advisory Given  Plan Discussed with: CRNA  Anesthesia Plan Comments: (Patient consented for risks of anesthesia including but not limited to:  - adverse reactions to medications - damage to teeth, lips or other oral mucosa - sore throat or hoarseness - Damage to heart, brain, lungs or loss of life  Patient voiced understanding.)         Anesthesia Quick Evaluation

## 2016-10-06 ENCOUNTER — Other Ambulatory Visit: Payer: Self-pay | Admitting: Psychiatry

## 2016-10-07 ENCOUNTER — Encounter: Payer: Self-pay | Admitting: Anesthesiology

## 2016-10-07 ENCOUNTER — Encounter (HOSPITAL_BASED_OUTPATIENT_CLINIC_OR_DEPARTMENT_OTHER)
Admission: RE | Admit: 2016-10-07 | Discharge: 2016-10-07 | Disposition: A | Discharge status: Home / Self Care | Payer: 59 | Source: Ambulatory Visit | Attending: Psychiatry | Admitting: Psychiatry

## 2016-10-07 DIAGNOSIS — Z833 Family history of diabetes mellitus: Secondary | ICD-10-CM | POA: Diagnosis not present

## 2016-10-07 DIAGNOSIS — F17210 Nicotine dependence, cigarettes, uncomplicated: Secondary | ICD-10-CM | POA: Diagnosis not present

## 2016-10-07 DIAGNOSIS — Z818 Family history of other mental and behavioral disorders: Secondary | ICD-10-CM | POA: Diagnosis not present

## 2016-10-07 DIAGNOSIS — F332 Major depressive disorder, recurrent severe without psychotic features: Secondary | ICD-10-CM | POA: Diagnosis not present

## 2016-10-07 DIAGNOSIS — F2089 Other schizophrenia: Secondary | ICD-10-CM | POA: Diagnosis not present

## 2016-10-07 DIAGNOSIS — J45909 Unspecified asthma, uncomplicated: Secondary | ICD-10-CM | POA: Diagnosis not present

## 2016-10-07 DIAGNOSIS — Z9889 Other specified postprocedural states: Secondary | ICD-10-CM | POA: Diagnosis not present

## 2016-10-07 DIAGNOSIS — F3189 Other bipolar disorder: Secondary | ICD-10-CM | POA: Diagnosis not present

## 2016-10-07 DIAGNOSIS — E039 Hypothyroidism, unspecified: Secondary | ICD-10-CM | POA: Diagnosis not present

## 2016-10-07 DIAGNOSIS — I10 Essential (primary) hypertension: Secondary | ICD-10-CM | POA: Diagnosis not present

## 2016-10-07 DIAGNOSIS — F339 Major depressive disorder, recurrent, unspecified: Secondary | ICD-10-CM | POA: Diagnosis not present

## 2016-10-07 DIAGNOSIS — R45851 Suicidal ideations: Secondary | ICD-10-CM | POA: Diagnosis not present

## 2016-10-07 MED ORDER — METHOHEXITAL SODIUM 100 MG/10ML IV SOSY
PREFILLED_SYRINGE | INTRAVENOUS | Status: DC | PRN
  Administered 2016-10-07: 70 mg via INTRAVENOUS

## 2016-10-07 MED ORDER — SODIUM CHLORIDE 0.9 % IV SOLN
INTRAVENOUS | Status: DC | PRN
  Administered 2016-10-07: 09:00:00 via INTRAVENOUS

## 2016-10-07 MED ORDER — MIDAZOLAM HCL 2 MG/2ML IJ SOLN
2.0000 mg | Freq: Once | INTRAMUSCULAR | Status: AC
  Administered 2016-10-07: 2 mg via INTRAVENOUS

## 2016-10-07 MED ORDER — SUCCINYLCHOLINE CHLORIDE 20 MG/ML IJ SOLN
INTRAMUSCULAR | Status: AC
  Filled 2016-10-07: qty 1

## 2016-10-07 MED ORDER — METHOHEXITAL SODIUM 0.5 G IJ SOLR
INTRAMUSCULAR | Status: AC
  Filled 2016-10-07: qty 500

## 2016-10-07 MED ORDER — MIDAZOLAM HCL 2 MG/2ML IJ SOLN
INTRAMUSCULAR | Status: AC
  Filled 2016-10-07: qty 2

## 2016-10-07 MED ORDER — KETOROLAC TROMETHAMINE 30 MG/ML IJ SOLN
30.0000 mg | Freq: Once | INTRAMUSCULAR | Status: AC
  Administered 2016-10-07: 30 mg via INTRAVENOUS

## 2016-10-07 MED ORDER — SODIUM CHLORIDE 0.9 % IV SOLN
500.0000 mL | Freq: Once | INTRAVENOUS | Status: AC
  Administered 2016-10-07: 1000 mL via INTRAVENOUS

## 2016-10-07 MED ORDER — KETOROLAC TROMETHAMINE 30 MG/ML IJ SOLN
INTRAMUSCULAR | Status: AC
  Administered 2016-10-07: 30 mg via INTRAVENOUS
  Filled 2016-10-07: qty 1

## 2016-10-07 MED ORDER — SUCCINYLCHOLINE CHLORIDE 200 MG/10ML IV SOSY
PREFILLED_SYRINGE | INTRAVENOUS | Status: DC | PRN
  Administered 2016-10-07: 100 mg via INTRAVENOUS

## 2016-10-07 NOTE — Anesthesia Postprocedure Evaluation (Signed)
Anesthesia Post Note  Patient: Zachary Dean  Procedure(s) Performed: * No procedures listed *  Patient location during evaluation: PACU Anesthesia Type: General Level of consciousness: awake and alert Pain management: pain level controlled Vital Signs Assessment: post-procedure vital signs reviewed and stable Respiratory status: spontaneous breathing, respiratory function stable and nonlabored ventilation Cardiovascular status: blood pressure returned to baseline and stable Postop Assessment: no signs of nausea or vomiting Anesthetic complications: no     Last Vitals:  Vitals:   10/07/16 1115 10/07/16 1121  BP:  124/76  Pulse: 91 80  Resp: 10 18  Temp:      Last Pain:  Vitals:   10/07/16 0843  TempSrc: Oral                 Homero Hyson

## 2016-10-07 NOTE — Discharge Instructions (Signed)
1)  The drugs that you have been given will stay in your system until tomorrow so for the       next 24 hours you should not:  A. Drive an automobile  B. Make any legal decisions  C. Drink any alcoholic beverages  2)  You may resume your regular meals upon return home.  3)  A responsible adult must take you home.  Someone should stay with you for a few          hours, then be available by phone for the remainder of the treatment day.  4)  You May experience any of the following symptoms:  Headache, Nausea and a dry mouth (due to the medications you were given),  temporary memory loss and some confusion, or sore muscles (a warm bath  should help this).  If you you experience any of these symptoms let us know on                your return visit.  5)  Report any of the following: any acute discomfort, severe headache, or temperature        greater than 100.5 F.   Also report any unusual redness, swelling, drainage, or pain         at your IV site.    You may report Symptoms to:  [place] at [facility]          Phone: [phone], ECT Department           or Dr. Prescott Gum office [phone]  6)  Your next ECT Treatment is Day Friday  Date July 13,2018  We will call 2 days prior to your scheduled appointment for arrival times.  7)  Nothing to eat or drink after midnight the night before your procedure.  8)  Take .    With a sip of water the morning of your procedure.  9)  Other Instructions: Call [phone] to cancel the morning of your procedure due         to illness or emergency.  10) We will call within 72 hours to assess how you are feeling.

## 2016-10-07 NOTE — Procedures (Signed)
ECT SERVICES Physician's Interval Evaluation & Treatment Note  Patient Identification: Lamarkus Nebel III MRN:  [id number] Date of Evaluation:  10/07/2016 TX #: 3  MADRS:   MMSE:   P.E. Findings:  No change to physical exam. Heart and lungs normal. Vitals unremarkable  Psychiatric Interval Note:  Mood continues to be depressed  Subjective:  Patient is a 36 y.o. male seen for evaluation for Electroconvulsive Therapy. No new complaint  Treatment Summary:   [x]   Right Unilateral             []  Bilateral   % Energy : 0.3 ms 40%   Impedance: 830 ohms  Seizure Energy Index: 60,797 V squared  Postictal Suppression Index: 54%  Seizure Concordance Index: 63%  Medications  Pre Shock: Toradol 30 mg Brevital 70 mg succinylcholine 100 mg (strongly consider adding Robinul for subsequent treatments)  Post Shock:    Seizure Duration: 64 seconds by EMG 64 seconds by EEG   Comments: Follow-up in 2 days continue index course   Lungs:  [x]   Clear to auscultation               []  Other:   Heart:    [x]   Regular rhythm             []  irregular rhythm    [x]   Previous H&P reviewed, patient examined and there are NO CHANGES                 []   Previous H&P reviewed, patient examined and there are changes noted.   Alethia Berthold, MD 7/11/201810:23 AM

## 2016-10-07 NOTE — H&P (Signed)
Zachary Dean is an 36 y.o. male.   Chief Complaint: Still feeling depressed. Not much difference. No complaints or side effects however HPI: Chronic recurrent severe depression  Past Medical History:  Diagnosis Date  . Anxiety   . Asthma    childhood asthma  . Bipolar disorder ([place])   . Depression   . Hypertension   . Hypothyroid   . Thyroid disease     Past Surgical History:  Procedure Laterality Date  . TONSILLECTOMY     Age 41 years old    Family History  Problem Relation Age of Onset  . Cancer Father   . Diabetes Father   . Anxiety disorder Mother   . Depression Mother   . Anxiety disorder Sister   . Depression Sister    Social History:  reports that he has been smoking Cigarettes.  He has a 5.00 pack-year smoking history. He has never used smokeless tobacco. He reports that he does not drink alcohol or use drugs.  Allergies:  Allergies  Allergen Reactions  . Antihistamines, Diphenhydramine-Type Anxiety and Other (See Comments)    As told by pt and his wife "restless leg syndrome"     (Not in a hospital admission)  No results found for this or any previous visit (from the past 48 hour(s)). No results found.  Review of Systems  Constitutional: Negative.   HENT: Negative.   Eyes: Negative.   Respiratory: Negative.   Cardiovascular: Negative.   Gastrointestinal: Negative.   Musculoskeletal: Negative.   Skin: Negative.   Neurological: Negative.   Psychiatric/Behavioral: Positive for depression. Negative for hallucinations, memory loss, substance abuse and suicidal ideas. The patient is nervous/anxious and has insomnia.     Blood pressure 116/82, pulse 90, temperature 98.4 F (36.9 C), temperature source Oral, resp. rate 16, height 5\' 7"  (1.702 m), weight 68.5 kg (151 lb), SpO2 97 %. Physical Exam  Nursing note and vitals reviewed. Constitutional: He appears well-developed and well-nourished.  HENT:  Head: Normocephalic and atraumatic.   Eyes: Conjunctivae are normal. Pupils are equal, round, and reactive to light.  Neck: Normal range of motion.  Cardiovascular: Regular rhythm and normal heart sounds.   Respiratory: Effort normal. No respiratory distress.  GI: Soft.  Musculoskeletal: Normal range of motion.  Neurological: He is alert.  Skin: Skin is warm and dry.  Psychiatric: Judgment normal. His affect is blunt. His speech is delayed. He is slowed. Cognition and memory are normal. He expresses no suicidal ideation.     Assessment/Plan This is treatment #3 continue with treatment using the same medicines and settings for now. Discussed with patient what to look for 2 notice improvement.  Alethia Berthold, MD 10/07/2016, 10:21 AM

## 2016-10-07 NOTE — Anesthesia Preprocedure Evaluation (Signed)
Anesthesia Evaluation  Patient identified by MRN, date of birth, ID band Patient awake    Reviewed: Allergy & Precautions, NPO status , Patient's Chart, lab work & pertinent test results  History of Anesthesia Complications Negative for: history of anesthetic complications  Airway Mallampati: II  TM Distance: >3 FB Neck ROM: Full    Dental no notable dental hx.    Pulmonary asthma , Current Smoker,    breath sounds clear to auscultation- rhonchi (-) wheezing      Cardiovascular hypertension, Pt. on medications (-) CAD, (-) Past MI and (-) Cardiac Stents  Rhythm:Regular Rate:Normal - Systolic murmurs and - Diastolic murmurs    Neuro/Psych PSYCHIATRIC DISORDERS Anxiety Depression Bipolar Disorder Schizophrenia    GI/Hepatic negative GI ROS, Neg liver ROS,   Endo/Other  neg diabetesHypothyroidism   Renal/GU negative Renal ROS     Musculoskeletal negative musculoskeletal ROS (+)   Abdominal (+) - obese,   Peds  Hematology negative hematology ROS (+)   Anesthesia Other Findings Past Medical History: No date: Anxiety No date: Asthma     Comment: childhood asthma No date: Bipolar disorder ([place]) No date: Depression No date: Hypertension No date: Hypothyroid No date: Thyroid disease   Reproductive/Obstetrics                             Anesthesia Physical Anesthesia Plan  ASA: II  Anesthesia Plan: General   Post-op Pain Management:    Induction: Intravenous  PONV Risk Score and Plan: 0  Airway Management Planned: Mask  Additional Equipment:   Intra-op Plan:   Post-operative Plan:   Informed Consent: I have reviewed the patients History and Physical, chart, labs and discussed the procedure including the risks, benefits and alternatives for the proposed anesthesia with the patient or authorized representative who has indicated his/her understanding and acceptance.   Dental  advisory given  Plan Discussed with: CRNA and Anesthesiologist  Anesthesia Plan Comments:         Anesthesia Quick Evaluation

## 2016-10-07 NOTE — Transfer of Care (Signed)
Immediate Anesthesia Transfer of Care Note  Patient: Zachary Dean  Procedure(s) Performed: ECT  Patient Location: PACU  Anesthesia Type:General  Level of Consciousness: sedated  Airway & Oxygen Therapy: Patient Spontanous Breathing and Patient connected to face mask oxygen  Post-op Assessment: Report given to RN and Post -op Vital signs reviewed and stable  Post vital signs: Reviewed and stable  Last Vitals:  Vitals:   10/07/16 0843 10/07/16 1038  BP: 116/82   Pulse: 90 (!) 115  Resp: 16 (!) 27  Temp: 36.9 C 36.8 C    Last Pain:  Vitals:   10/07/16 0843  TempSrc: Oral         Complications: No apparent anesthesia complications

## 2016-10-07 NOTE — Anesthesia Procedure Notes (Signed)
Date/Time: 10/07/2016 10:29 AM Performed by: Dionne Bucy Pre-anesthesia Checklist: Patient identified, Emergency Drugs available, Suction available and Patient being monitored Patient Re-evaluated:Patient Re-evaluated prior to inductionOxygen Delivery Method: Circle system utilized Preoxygenation: Pre-oxygenation with 100% oxygen Intubation Type: IV induction Ventilation: Mask ventilation without difficulty and Mask ventilation throughout procedure Airway Equipment and Method: Bite block Placement Confirmation: positive ETCO2 Dental Injury: Teeth and Oropharynx as per pre-operative assessment

## 2016-10-07 NOTE — Anesthesia Post-op Follow-up Note (Cosign Needed)
Anesthesia QCDR form completed.        

## 2016-10-08 ENCOUNTER — Other Ambulatory Visit: Payer: Self-pay | Admitting: Psychiatry

## 2016-10-09 ENCOUNTER — Encounter: Payer: Self-pay | Admitting: Anesthesiology

## 2016-10-09 ENCOUNTER — Encounter (HOSPITAL_BASED_OUTPATIENT_CLINIC_OR_DEPARTMENT_OTHER)
Admission: RE | Admit: 2016-10-09 | Discharge: 2016-10-09 | Disposition: A | Discharge status: Home / Self Care | Payer: 59 | Source: Ambulatory Visit | Attending: Psychiatry | Admitting: Psychiatry

## 2016-10-09 DIAGNOSIS — F17210 Nicotine dependence, cigarettes, uncomplicated: Secondary | ICD-10-CM | POA: Diagnosis not present

## 2016-10-09 DIAGNOSIS — Z833 Family history of diabetes mellitus: Secondary | ICD-10-CM | POA: Diagnosis not present

## 2016-10-09 DIAGNOSIS — F332 Major depressive disorder, recurrent severe without psychotic features: Secondary | ICD-10-CM | POA: Diagnosis not present

## 2016-10-09 DIAGNOSIS — I10 Essential (primary) hypertension: Secondary | ICD-10-CM | POA: Diagnosis not present

## 2016-10-09 DIAGNOSIS — F3189 Other bipolar disorder: Secondary | ICD-10-CM | POA: Diagnosis not present

## 2016-10-09 DIAGNOSIS — F339 Major depressive disorder, recurrent, unspecified: Secondary | ICD-10-CM | POA: Diagnosis not present

## 2016-10-09 DIAGNOSIS — Z818 Family history of other mental and behavioral disorders: Secondary | ICD-10-CM | POA: Diagnosis not present

## 2016-10-09 DIAGNOSIS — R45851 Suicidal ideations: Secondary | ICD-10-CM | POA: Diagnosis not present

## 2016-10-09 DIAGNOSIS — J45909 Unspecified asthma, uncomplicated: Secondary | ICD-10-CM | POA: Diagnosis not present

## 2016-10-09 DIAGNOSIS — Z9889 Other specified postprocedural states: Secondary | ICD-10-CM | POA: Diagnosis not present

## 2016-10-09 DIAGNOSIS — F2089 Other schizophrenia: Secondary | ICD-10-CM | POA: Diagnosis not present

## 2016-10-09 DIAGNOSIS — E039 Hypothyroidism, unspecified: Secondary | ICD-10-CM | POA: Diagnosis not present

## 2016-10-09 MED ORDER — METHOHEXITAL SODIUM 0.5 G IJ SOLR
INTRAMUSCULAR | Status: AC
  Filled 2016-10-09: qty 500

## 2016-10-09 MED ORDER — SODIUM CHLORIDE 0.9 % IV SOLN
500.0000 mL | Freq: Once | INTRAVENOUS | Status: AC
  Administered 2016-10-09: 1000 mL via INTRAVENOUS

## 2016-10-09 MED ORDER — SUCCINYLCHOLINE CHLORIDE 200 MG/10ML IV SOSY
PREFILLED_SYRINGE | INTRAVENOUS | Status: DC | PRN
  Administered 2016-10-09: 100 mg via INTRAVENOUS

## 2016-10-09 MED ORDER — SODIUM CHLORIDE 0.9 % IV SOLN
INTRAVENOUS | Status: DC | PRN
  Administered 2016-10-09: 09:00:00 via INTRAVENOUS

## 2016-10-09 MED ORDER — METHOHEXITAL SODIUM 100 MG/10ML IV SOSY
PREFILLED_SYRINGE | INTRAVENOUS | Status: DC | PRN
  Administered 2016-10-09: 70 mg via INTRAVENOUS

## 2016-10-09 MED ORDER — MIDAZOLAM HCL 2 MG/2ML IJ SOLN
INTRAMUSCULAR | Status: DC | PRN
  Administered 2016-10-09: 2 mg via INTRAVENOUS

## 2016-10-09 MED ORDER — KETOROLAC TROMETHAMINE 30 MG/ML IJ SOLN
30.0000 mg | Freq: Once | INTRAMUSCULAR | Status: AC
  Administered 2016-10-09: 30 mg via INTRAVENOUS

## 2016-10-09 MED ORDER — KETOROLAC TROMETHAMINE 30 MG/ML IJ SOLN
INTRAMUSCULAR | Status: AC
  Filled 2016-10-09: qty 1

## 2016-10-09 MED ORDER — ONDANSETRON HCL 4 MG/2ML IJ SOLN: 4.0000 mg | Freq: Once | INTRAMUSCULAR | Status: DC | PRN

## 2016-10-09 MED ORDER — SUCCINYLCHOLINE CHLORIDE 20 MG/ML IJ SOLN
INTRAMUSCULAR | Status: AC
  Filled 2016-10-09: qty 1

## 2016-10-09 MED ORDER — MIDAZOLAM HCL 2 MG/2ML IJ SOLN
INTRAMUSCULAR | Status: AC
  Filled 2016-10-09: qty 2

## 2016-10-09 MED ORDER — GLYCOPYRROLATE 0.2 MG/ML IJ SOLN
0.2000 mg | Freq: Once | INTRAMUSCULAR | Status: AC
  Administered 2016-10-09: 0.2 mg via INTRAVENOUS

## 2016-10-09 MED ORDER — GLYCOPYRROLATE 0.2 MG/ML IJ SOLN
INTRAMUSCULAR | Status: AC
  Administered 2016-10-09: 0.2 mg via INTRAVENOUS
  Filled 2016-10-09: qty 1

## 2016-10-09 NOTE — Anesthesia Postprocedure Evaluation (Signed)
Anesthesia Post Note  Patient: Zachary Dean  Procedure(s) Performed: * No procedures listed *  Patient location during evaluation: PACU Anesthesia Type: General Level of consciousness: awake and alert Pain management: pain level controlled Vital Signs Assessment: post-procedure vital signs reviewed and stable Respiratory status: spontaneous breathing, nonlabored ventilation, respiratory function stable and patient connected to nasal cannula oxygen Cardiovascular status: blood pressure returned to baseline and stable Postop Assessment: no signs of nausea or vomiting Anesthetic complications: no     Last Vitals:  Vitals:   10/09/16 1052 10/09/16 1053  BP: (!) 141/86 140/74  Pulse: 82 78  Resp: 12 16  Temp:      Last Pain:  Vitals:   10/09/16 1053  TempSrc:   PainSc: 0-No pain                 Martha Clan

## 2016-10-09 NOTE — Anesthesia Procedure Notes (Signed)
Date/Time: 10/09/2016 10:13 AM Performed by: Dionne Bucy Pre-anesthesia Checklist: Patient identified, Emergency Drugs available, Suction available and Patient being monitored Patient Re-evaluated:Patient Re-evaluated prior to induction Oxygen Delivery Method: Circle system utilized Preoxygenation: Pre-oxygenation with 100% oxygen Induction Type: IV induction Ventilation: Mask ventilation without difficulty and Mask ventilation throughout procedure Airway Equipment and Method: Bite block Placement Confirmation: positive ETCO2 Dental Injury: Teeth and Oropharynx as per pre-operative assessment

## 2016-10-09 NOTE — Discharge Instructions (Signed)
1)  The drugs that you have been given will stay in your system until tomorrow so for the       next 24 hours you should not:  A. Drive an automobile  B. Make any legal decisions  C. Drink any alcoholic beverages  2)  You may resume your regular meals upon return home.  3)  A responsible adult must take you home.  Someone should stay with you for a few          hours, then be available by phone for the remainder of the treatment day.  4)  You May experience any of the following symptoms:  Headache, Nausea and a dry mouth (due to the medications you were given),  temporary memory loss and some confusion, or sore muscles (a warm bath  should help this).  If you you experience any of these symptoms let us know on                your return visit.  5)  Report any of the following: any acute discomfort, severe headache, or temperature        greater than 100.5 F.   Also report any unusual redness, swelling, drainage, or pain         at your IV site.    You may report Symptoms to:  [facility]          Phone: [phone], ECT Department           or Dr. Prescott Gum office [phone]  6)  Your next ECT Treatment is Day  Monday  Date  October 12, 2016  We will call 2 days prior to your scheduled appointment for arrival times.  7)  Nothing to eat or drink after midnight the night before your procedure.  8)  Take .    With a sip of water the morning of your procedure.  9)  Other Instructions: Call [phone] to cancel the morning of your procedure due         to illness or emergency.  10) We will call within 72 hours to assess how you are feeling.

## 2016-10-09 NOTE — Anesthesia Post-op Follow-up Note (Cosign Needed)
Anesthesia QCDR form completed.        

## 2016-10-09 NOTE — H&P (Signed)
Zachary Dean is an 36 y.o. male.   Chief Complaint: Patient has no new complaint remains depressed and withdrawn HPI: History of recurrent severe depression and not responding to medication  Past Medical History:  Diagnosis Date  . Anxiety   . Asthma    childhood asthma  . Bipolar disorder ([place])   . Depression   . Hypertension   . Hypothyroid   . Thyroid disease     Past Surgical History:  Procedure Laterality Date  . TONSILLECTOMY     Age 33 years old    Family History  Problem Relation Age of Onset  . Cancer Father   . Diabetes Father   . Anxiety disorder Mother   . Depression Mother   . Anxiety disorder Sister   . Depression Sister    Social History:  reports that he has been smoking Cigarettes.  He has a 5.00 pack-year smoking history. He has never used smokeless tobacco. He reports that he does not drink alcohol or use drugs.  Allergies:  Allergies  Allergen Reactions  . Antihistamines, Diphenhydramine-Type Anxiety and Other (See Comments)    As told by pt and his wife "restless leg syndrome"     (Not in a hospital admission)  No results found for this or any previous visit (from the past 48 hour(s)). No results found.  Review of Systems  Constitutional: Negative.   HENT: Negative.   Eyes: Negative.   Respiratory: Negative.   Cardiovascular: Negative.   Gastrointestinal: Negative.   Musculoskeletal: Negative.   Skin: Negative.   Neurological: Negative.   Psychiatric/Behavioral: Positive for depression. Negative for hallucinations, memory loss, substance abuse and suicidal ideas. The patient is not nervous/anxious and does not have insomnia.     Blood pressure 139/82, pulse 74, temperature 97.8 F (36.6 C), temperature source Oral, resp. rate 18, height 5\' 7"  (1.702 m), weight 152 lb (68.9 kg), SpO2 100 %. Physical Exam  Nursing note and vitals reviewed. Constitutional: He appears well-developed and well-nourished.  HENT:  Head:  Normocephalic and atraumatic.  Eyes: Pupils are equal, round, and reactive to light. Conjunctivae are normal.  Neck: Normal range of motion.  Cardiovascular: Regular rhythm and normal heart sounds.   Respiratory: Effort normal. No respiratory distress.  GI: Soft.  Musculoskeletal: Normal range of motion.  Neurological: He is alert.  Skin: Skin is warm and dry.  Psychiatric: Judgment normal. His affect is blunt. His speech is delayed. He is slowed. Cognition and memory are normal. He exhibits a depressed mood. He expresses no suicidal ideation.     Assessment/Plan Treatment today and then continue into next week although he does not feel any better I'm going to recommend we switch to bilateral treatment  Alethia Berthold, MD 10/09/2016, 10:01 AM

## 2016-10-09 NOTE — Anesthesia Preprocedure Evaluation (Signed)
Anesthesia Evaluation  Patient identified by MRN, date of birth, ID band Patient awake    Reviewed: Allergy & Precautions, NPO status , Patient's Chart, lab work & pertinent test results  History of Anesthesia Complications Negative for: history of anesthetic complications  Airway Mallampati: II  TM Distance: >3 FB Neck ROM: Full    Dental no notable dental hx.    Pulmonary asthma , Current Smoker,    breath sounds clear to auscultation- rhonchi (-) wheezing      Cardiovascular hypertension, Pt. on medications (-) CAD, (-) Past MI and (-) Cardiac Stents  Rhythm:Regular Rate:Normal - Systolic murmurs and - Diastolic murmurs    Neuro/Psych PSYCHIATRIC DISORDERS Anxiety Depression Bipolar Disorder Schizophrenia    GI/Hepatic negative GI ROS, Neg liver ROS,   Endo/Other  neg diabetesHypothyroidism   Renal/GU negative Renal ROS     Musculoskeletal negative musculoskeletal ROS (+)   Abdominal (+) - obese,   Peds  Hematology negative hematology ROS (+)   Anesthesia Other Findings    Reproductive/Obstetrics                             Anesthesia Physical  Anesthesia Plan  ASA: II  Anesthesia Plan: General   Post-op Pain Management:    Induction: Intravenous  PONV Risk Score and Plan: 0  Airway Management Planned: Mask  Additional Equipment:   Intra-op Plan:   Post-operative Plan:   Informed Consent: I have reviewed the patients History and Physical, chart, labs and discussed the procedure including the risks, benefits and alternatives for the proposed anesthesia with the patient or authorized representative who has indicated his/her understanding and acceptance.   Dental advisory given  Plan Discussed with: CRNA and Anesthesiologist  Anesthesia Plan Comments:         Anesthesia Quick Evaluation

## 2016-10-09 NOTE — Transfer of Care (Addendum)
Immediate Anesthesia Transfer of Care Note  Patient: Zachary Dean  Procedure(s) Performed: ECT  Patient Location: PACU  Anesthesia Type:General  Level of Consciousness: sedated  Airway & Oxygen Therapy: Patient Spontanous Breathing  Post-op Assessment: Report given to RN and Post -op Vital signs reviewed and stable  Post vital signs: Reviewed and stable  Last Vitals:  Vitals:   10/09/16 1022 10/09/16 1037  BP: (!) 143/75 (!) 148/89  Pulse: 98 94  Resp: 18 14  Temp: 36.9 C     Last Pain:  Vitals:   10/09/16 1042  TempSrc:   PainSc: 0-No pain         Complications: No apparent anesthesia complications

## 2016-10-09 NOTE — Procedures (Signed)
[facility] Physician's Interval Evaluation & Treatment Note  Patient Identification: Zachary Dean MRN:  [id number] Date of Evaluation:  10/09/2016 TX #: 4  MADRS: 27  MMSE: 30  P.E. Findings:  No change to physical exam. Vitals unremarkable heart and lungs normal  Psychiatric Interval Note:  Subjectively does not feel better  Subjective:  Patient is a 36 y.o. male seen for evaluation for Electroconvulsive Therapy. No specific new complaint  Treatment Summary:   [x]   Right Unilateral             []  Bilateral   % Energy : 0.3 ms 40%   Impedance: 1520 homes  Seizure Energy Index: 18,863 V squared  Postictal Suppression Index: 54%  Seizure Concordance Index: 95%  Medications  Pre Shock: Robinul 0.2 mg Toradol 30 mg Brevital 70 mg succinylcholine 100 mg  Post Shock:    Seizure Duration: 45 seconds by EMG 70 seconds by EEG   Comments: Follow-up Monday but if he is not feeling significantly better suggest changing to bilateral treatment   Lungs:  [x]   Clear to auscultation               []  Other:   Heart:    [x]   Regular rhythm             []  irregular rhythm    [x]   Previous H&P reviewed, patient examined and there are NO CHANGES                 []   Previous H&P reviewed, patient examined and there are changes noted.   Alethia Berthold, MD 7/13/201810:04 AM

## 2016-10-12 ENCOUNTER — Telehealth: Payer: Self-pay | Admitting: *Deleted

## 2016-10-12 ENCOUNTER — Encounter: Payer: Self-pay | Admitting: Certified Registered Nurse Anesthetist

## 2016-10-12 ENCOUNTER — Inpatient Hospital Stay: Admission: RE | Admit: 2016-10-12 | Payer: Self-pay | Source: Ambulatory Visit

## 2016-10-13 ENCOUNTER — Other Ambulatory Visit: Payer: Self-pay | Admitting: Psychiatry

## 2016-10-20 MED FILL — DIVALPROEX SOD ER 500 MG TA: 500 | 90 days supply | Qty: 180 | Fill #0

## 2016-10-20 MED FILL — BENZTROPINE MES 1 MG TABLET: 1 | 90 days supply | Qty: 180 | Fill #0

## 2016-10-20 MED FILL — clonazePAM 2 MG TABS: 2 | 90 days supply | Qty: 270 | Fill #0

## 2016-10-21 DIAGNOSIS — F4310 Post-traumatic stress disorder, unspecified: Secondary | ICD-10-CM | POA: Diagnosis not present

## 2016-11-03 DIAGNOSIS — F4310 Post-traumatic stress disorder, unspecified: Secondary | ICD-10-CM | POA: Diagnosis not present

## 2016-11-17 MED FILL — MYDAYIS ER 50 MG CAPSULE: 50 | 30 days supply | Qty: 30 | Fill #0

## 2016-11-17 MED FILL — SILDENAFIL CITRATE 100 MG T: 100 | 90 days supply | Qty: 18 | Fill #0

## 2016-11-23 MED FILL — GABAPENTIN 600 MG TABLET: 600 | 90 days supply | Qty: 540 | Fill #0

## 2016-11-27 MED FILL — VIIBRYD 40 MG TABLET: 40 | 90 days supply | Qty: 90 | Fill #0

## 2016-12-01 DIAGNOSIS — F4312 Post-traumatic stress disorder, chronic: Secondary | ICD-10-CM | POA: Diagnosis not present

## 2016-12-01 DIAGNOSIS — F603 Borderline personality disorder: Secondary | ICD-10-CM | POA: Diagnosis not present

## 2016-12-15 DIAGNOSIS — F4310 Post-traumatic stress disorder, unspecified: Secondary | ICD-10-CM | POA: Diagnosis not present

## 2016-12-17 DIAGNOSIS — F4312 Post-traumatic stress disorder, chronic: Secondary | ICD-10-CM | POA: Diagnosis not present

## 2016-12-17 DIAGNOSIS — F603 Borderline personality disorder: Secondary | ICD-10-CM | POA: Diagnosis not present

## 2016-12-18 MED FILL — AMPHETAMINE SALTS 30 MG TAB: 30 | 30 days supply | Qty: 90 | Fill #0

## 2016-12-23 DIAGNOSIS — F4310 Post-traumatic stress disorder, unspecified: Secondary | ICD-10-CM | POA: Diagnosis not present

## 2016-12-28 DIAGNOSIS — F332 Major depressive disorder, recurrent severe without psychotic features: Secondary | ICD-10-CM | POA: Diagnosis not present

## 2016-12-30 DIAGNOSIS — F4310 Post-traumatic stress disorder, unspecified: Secondary | ICD-10-CM | POA: Diagnosis not present

## 2016-12-30 DIAGNOSIS — F332 Major depressive disorder, recurrent severe without psychotic features: Secondary | ICD-10-CM | POA: Diagnosis not present

## 2017-01-04 MED FILL — LEVOTHYROXINE 25 MCG TABLET: 25 | 90 days supply | Qty: 90 | Fill #3

## 2017-01-05 DIAGNOSIS — F4310 Post-traumatic stress disorder, unspecified: Secondary | ICD-10-CM | POA: Diagnosis not present

## 2017-01-11 DIAGNOSIS — F332 Major depressive disorder, recurrent severe without psychotic features: Secondary | ICD-10-CM | POA: Diagnosis not present

## 2017-01-11 DIAGNOSIS — F4310 Post-traumatic stress disorder, unspecified: Secondary | ICD-10-CM | POA: Diagnosis not present

## 2017-01-19 DIAGNOSIS — F4310 Post-traumatic stress disorder, unspecified: Secondary | ICD-10-CM | POA: Diagnosis not present

## 2017-01-21 MED FILL — PRAZOSIN 1 MG CAPSULE: 1 | 60 days supply | Qty: 360 | Fill #1

## 2017-01-21 MED FILL — DIVALPROEX SOD ER 500 MG TA: 500 | 90 days supply | Qty: 180 | Fill #1

## 2017-01-25 MED FILL — REXULTI 3 MG TABLET: 3 | 90 days supply | Qty: 90 | Fill #0

## 2017-02-02 MED FILL — DEXTROAMP-AMPHETAMIN 30 MG: 30 | 30 days supply | Qty: 90 | Fill #0

## 2017-02-08 DIAGNOSIS — H5203 Hypermetropia, bilateral: Secondary | ICD-10-CM | POA: Diagnosis not present

## 2017-02-19 MED FILL — SILDENAFIL CITRATE 100 MG T: 100 | 90 days supply | Qty: 18 | Fill #1

## 2017-02-19 MED FILL — GABAPENTIN 600 MG TABLET: 600 | 90 days supply | Qty: 540 | Fill #1

## 2017-02-19 MED FILL — VIIBRYD 40 MG TABLET: 40 | 90 days supply | Qty: 90 | Fill #1

## 2017-02-22 MED FILL — PROPRANOLOL 20 MG TABLET: 20 | 90 days supply | Qty: 270 | Fill #0

## 2017-02-25 DIAGNOSIS — F4310 Post-traumatic stress disorder, unspecified: Secondary | ICD-10-CM | POA: Diagnosis not present

## 2017-03-03 DIAGNOSIS — F4310 Post-traumatic stress disorder, unspecified: Secondary | ICD-10-CM | POA: Diagnosis not present

## 2017-03-05 MED FILL — AMPHETAMINE SALTS 30 MG TAB: 30 | 30 days supply | Qty: 90 | Fill #0

## 2017-03-10 DIAGNOSIS — F4310 Post-traumatic stress disorder, unspecified: Secondary | ICD-10-CM | POA: Diagnosis not present

## 2017-03-17 DIAGNOSIS — F4310 Post-traumatic stress disorder, unspecified: Secondary | ICD-10-CM | POA: Diagnosis not present

## 2017-03-25 DIAGNOSIS — F4310 Post-traumatic stress disorder, unspecified: Secondary | ICD-10-CM | POA: Diagnosis not present

## 2017-04-01 DIAGNOSIS — F4310 Post-traumatic stress disorder, unspecified: Secondary | ICD-10-CM | POA: Diagnosis not present

## 2017-04-06 MED FILL — AMPHETAMINE SALTS 30 MG TAB: 30 | 30 days supply | Qty: 90 | Fill #0

## 2017-04-09 DIAGNOSIS — Z3141 Encounter for fertility testing: Secondary | ICD-10-CM | POA: Diagnosis not present

## 2017-04-13 MED FILL — rOPINIRole HCL 1 MG TABS: 1 | 90 days supply | Qty: 90 | Fill #1

## 2017-04-13 MED FILL — LEVOTHYROXINE 25 MCG TABLET: 25 | 30 days supply | Qty: 30 | Fill #0

## 2017-04-14 DIAGNOSIS — F4310 Post-traumatic stress disorder, unspecified: Secondary | ICD-10-CM | POA: Diagnosis not present

## 2017-04-28 MED FILL — LITHIUM ER 450 MG TABLET: 450 | 90 days supply | Qty: 90 | Fill #0

## 2017-05-10 DIAGNOSIS — F4310 Post-traumatic stress disorder, unspecified: Secondary | ICD-10-CM | POA: Diagnosis not present

## 2017-05-10 MED FILL — LEVOTHYROXINE 25 MCG TABLET: 25 | 90 days supply | Qty: 90 | Fill #0

## 2017-05-10 MED FILL — AMPHETAMINE-DEXTROAMPHETAMI: 30 | 30 days supply | Qty: 90 | Fill #0

## 2017-05-12 DIAGNOSIS — D3131 Benign neoplasm of right choroid: Secondary | ICD-10-CM | POA: Diagnosis not present

## 2017-05-25 ENCOUNTER — Ambulatory Visit (INDEPENDENT_AMBULATORY_CARE_PROVIDER_SITE_OTHER): Payer: 59 | Admitting: Urology

## 2017-05-25 DIAGNOSIS — N5201 Erectile dysfunction due to arterial insufficiency: Secondary | ICD-10-CM | POA: Diagnosis not present

## 2017-05-25 DIAGNOSIS — N468 Other male infertility: Secondary | ICD-10-CM

## 2017-05-25 DIAGNOSIS — F4310 Post-traumatic stress disorder, unspecified: Secondary | ICD-10-CM | POA: Diagnosis not present

## 2017-05-25 MED FILL — buPROPion HCL ER (XL) 150 M: 150 | 90 days supply | Qty: 90 | Fill #0

## 2017-05-25 MED FILL — TRINTELLIX 20 MG TABLET: 20 | 30 days supply | Qty: 30 | Fill #0

## 2017-06-07 MED FILL — AMPHETAMINE SALTS 30 MG TAB: 30 | 30 days supply | Qty: 90 | Fill #0

## 2017-06-08 DIAGNOSIS — F4310 Post-traumatic stress disorder, unspecified: Secondary | ICD-10-CM | POA: Diagnosis not present

## 2017-06-22 DIAGNOSIS — F4310 Post-traumatic stress disorder, unspecified: Secondary | ICD-10-CM | POA: Diagnosis not present

## 2017-07-06 ENCOUNTER — Ambulatory Visit (INDEPENDENT_AMBULATORY_CARE_PROVIDER_SITE_OTHER): Payer: 59 | Admitting: Family Medicine

## 2017-07-06 ENCOUNTER — Encounter: Payer: Self-pay | Admitting: Family Medicine

## 2017-07-06 ENCOUNTER — Ambulatory Visit (INDEPENDENT_AMBULATORY_CARE_PROVIDER_SITE_OTHER): Payer: 59 | Admitting: Urology

## 2017-07-06 VITALS — BP 128/66 | HR 79 | Temp 97.8°F | Ht 67.0 in | Wt 142.0 lb

## 2017-07-06 DIAGNOSIS — Z8659 Personal history of other mental and behavioral disorders: Secondary | ICD-10-CM

## 2017-07-06 DIAGNOSIS — Z7689 Persons encountering health services in other specified circumstances: Secondary | ICD-10-CM | POA: Diagnosis not present

## 2017-07-06 DIAGNOSIS — N4611 Organic oligospermia: Secondary | ICD-10-CM

## 2017-07-06 DIAGNOSIS — K219 Gastro-esophageal reflux disease without esophagitis: Secondary | ICD-10-CM | POA: Diagnosis not present

## 2017-07-06 DIAGNOSIS — N5201 Erectile dysfunction due to arterial insufficiency: Secondary | ICD-10-CM | POA: Diagnosis not present

## 2017-07-06 DIAGNOSIS — N468 Other male infertility: Secondary | ICD-10-CM | POA: Diagnosis not present

## 2017-07-06 DIAGNOSIS — N5311 Retarded ejaculation: Secondary | ICD-10-CM | POA: Diagnosis not present

## 2017-07-06 MED FILL — SILDENAFIL CITRATE 100 MG T: 100 | 90 days supply | Qty: 18 | Fill #2

## 2017-07-06 NOTE — Progress Notes (Signed)
Patient presents to clinic today to establish care.  SUBJECTIVE: PMH: Pt is a 37 yo male with pmh sig for alcohol and drug abuse, MDD, psychosis, anxiety, PTSD, GERD, HTN, HLD, thyroid issues.  Patient was previously seen at [facility] family practice by Dr. Tollie Pizza.   Patient states his been a while since he has been seen.  GERD: -Patient notices symptoms with certain foods including peanut butter and fried foods -Patient takes Tums.  He states is the only thing that works for her symptoms.  MDD, anxiety, psychosis, PTSD: -Patient is followed by Dr. Rexene Edison, psychiatry. -pt describes his mood as horrible, his sleep as ok, and his energy as "depends on his mood" -pt denies SI/HI -pt is taking lithium ER 450 mg daily, Clonazepam, gabapentin 600 mg TID, levothyroxine 25 mcg daily (as an adjunct), adderall 30 mg TID, and trintellix 20 mg daily. -pt has an upcoming appt with psychiatry.  He was told he could be put on the wait list for a sooner appt.  Thyroid: -pt notes in the past his was told he had hypothyroidism, but it later resolved -he is currently still on levothyroxine 25 mcg daily as an adjunct for his depression meds.  H/o HTN: -pt not currently on meds -does not check bp at home -pt has a h/o significant wt loss, over 100 lbs -pt still does cardio most days. -tries to eat healthy  Allergies: Initial-causes restless leg  Past surgical history: Tonsillectomy 1988  Social history: Patient is married.  He currently works as a Financial controller.  Patient denies alcohol use and drug use.  Patient endorses tobacco use.   Health Maintenance: Dental --[facility] --Dr. Alois Cliche Immunizations --influenza vaccine April 23, 2017, tetanus vaccine 2019  Past Medical History:  Diagnosis Date  . Anxiety   . Asthma    childhood asthma  . Bipolar disorder ([place])   . Depression   . GERD (gastroesophageal reflux disease)   . Hyperlipidemia   . Hypertension   .  Hypothyroid   . Thyroid disease     Past Surgical History:  Procedure Laterality Date  . TONSILLECTOMY     Age 69 years old    Current Outpatient Medications on File Prior to Visit  Medication Sig Dispense Refill  . amphetamine-dextroamphetamine (ADDERALL) 30 MG tablet Take 30 mg by mouth 3 (three) times daily.    . clonazePAM (KLONOPIN) 1 MG tablet Take 2 mg by mouth daily.    Marland Kitchen gabapentin (NEURONTIN) 300 MG capsule Take 1 capsule (300 mg total) by mouth 3 (three) times daily. (Patient taking differently: Take 600 mg by mouth 3 (three) times daily. ) 90 capsule 0  . levothyroxine (SYNTHROID, LEVOTHROID) 25 MCG tablet Take 1 tablet (25 mcg total) by mouth daily before breakfast. 30 tablet 0  . lithium carbonate (ESKALITH) 450 MG CR tablet Take 450 mg by mouth daily.  4  . TRINTELLIX 20 MG TABS Take 20 mg by mouth daily.  4  . dextroamphetamine (DEXEDRINE SPANSULE) 15 MG 24 hr capsule Take 15 mg by mouth daily.   0  . Multiple Vitamin (MULTIVITAMIN WITH MINERALS) TABS tablet Take 1 tablet by mouth daily.    . prazosin (MINIPRESS) 1 MG capsule TAKE 6 CAPSULES  6 MG TOTAL  BY MOUTH AT BEDTIME  0  . REXULTI 3 MG TABS Take 1 tablet by mouth daily.  4  . VYVANSE 70 MG capsule Take 70 mg by mouth daily.   0  No current facility-administered medications on file prior to visit.     Allergies  Allergen Reactions  . Antihistamines, Diphenhydramine-Type Anxiety and Other (See Comments)    As told by pt and his wife "restless leg syndrome"    Family History  Problem Relation Age of Onset  . Cancer Father   . Diabetes Father   . Anxiety disorder Mother   . Depression Mother   . Anxiety disorder Sister   . Depression Sister     Social History   Socioeconomic History  . Marital status: Married    Spouse name: Not on file  . Number of children: Not on file  . Years of education: Not on file  . Highest education level: Not on file  Occupational History  . Not on file  Social Needs    . Financial resource strain: Not on file  . Food insecurity:    Worry: Not on file    Inability: Not on file  . Transportation needs:    Medical: Not on file    Non-medical: Not on file  Tobacco Use  . Smoking status: Current Every Day Smoker    Packs/day: 1.00    Years: 5.00    Pack years: 5.00    Types: Cigarettes  . Smokeless tobacco: Never Used  Substance and Sexual Activity  . Alcohol use: No  . Drug use: No  . Sexual activity: Yes  Lifestyle  . Physical activity:    Days per week: Not on file    Minutes per session: Not on file  . Stress: Not on file  Relationships  . Social connections:    Talks on phone: Not on file    Gets together: Not on file    Attends religious service: Not on file    Active member of club or organization: Not on file    Attends meetings of clubs or organizations: Not on file    Relationship status: Not on file  . Intimate partner violence:    Fear of current or ex partner: Not on file    Emotionally abused: Not on file    Physically abused: Not on file    Forced sexual activity: Not on file  Other Topics Concern  . Not on file  Social History Narrative  . Not on file    ROS General: Denies fever, chills, night sweats, changes in weight, changes in appetite HEENT: Denies headaches, ear pain, changes in vision, rhinorrhea, sore throat CV: Denies CP, palpitations, SOB, orthopnea Pulm: Denies SOB, cough, wheezing GI: Denies abdominal pain, nausea, vomiting, diarrhea, constipation GU: Denies dysuria, hematuria, frequency, vaginal discharge Msk: Denies muscle cramps, joint pains Neuro: Denies weakness, numbness, tingling Skin: Denies rashes, bruising Psych: Denies depression, anxiety, hallucinations  BP 128/66 (BP Location: Left Arm, Patient Position: Sitting, Cuff Size: Normal)   Pulse 79   Temp 97.8 F (36.6 C) (Oral)   Ht 5\' 7"  (1.702 m)   Wt 142 lb (64.4 kg)   SpO2 98%   BMI 22.24 kg/m   Physical Exam Gen. Pleasant, well  developed, well-nourished, in NAD HEENT - [place]/AT, PERRL, no scleral icterus, no nasal drainage, pharynx without erythema or exudate.  TMs normal bilaterally.  No cervical lymphadenopathy. Lungs: no use of accessory muscles, CTAB, no wheezes, rales or rhonchi Cardiovascular: RRR, No r/g/m, no peripheral edema Abdomen: BS present, soft, nontender, nondistended Neuro:  A&Ox3, CN II-XII intact, normal gait Skin:  Warm, dry, intact, no lesions Psych: normal affect, decreased mood, seems distant.  No results found for this or any previous visit (from the past 2160 hour(s)).  Assessment/Plan: Gastroesophageal reflux disease, esophagitis presence not specified -Patient given handout on foods known to cause GERD symptoms -Continue Tums as needed. -Symptoms become increasingly in frequency or severity patient.  History of depression -PHQ 9 score 20 -Gad 7 score 15 -Patient denies SI/HI.  Patient advised to contact psychiatry office for closer follow-up. -Patient given RTC or ED precautions -Given handout  Encounter to establish care -We reviewed the PMH, PSH, FH, SH, Meds and Allergies. -We provided refills for any medications we will prescribe as needed. -We addressed current concerns per orders and patient instructions. -We have asked for records for pertinent exams, studies, vaccines and notes from previous providers. -We have advised patient to follow up per instructions below.   Follow-up in 1 month, sooner if needed  Grier Mitts, MD

## 2017-07-06 NOTE — Patient Instructions (Signed)
Living With Anxiety  After being diagnosed with an anxiety disorder, you may be relieved to know why you have felt or behaved a certain way. It is natural to also feel overwhelmed about the treatment ahead and what it will mean for your life. With care and support, you can manage this condition and recover from it.  How to cope with anxiety  Dealing with stress  Stress is your body's reaction to life changes and events, both good and bad. Stress can last just a few hours or it can be ongoing. Stress can play a major role in anxiety, so it is important to learn both how to cope with stress and how to think about it differently.  Talk with your health care provider or a counselor to learn more about stress reduction. He or she may suggest some stress reduction techniques, such as:   Music therapy. This can include creating or listening to music that you enjoy and that inspires you.   Mindfulness-based meditation. This involves being aware of your normal breaths, rather than trying to control your breathing. It can be done while sitting or walking.   Centering prayer. This is a kind of meditation that involves focusing on a word, phrase, or sacred image that is meaningful to you and that brings you peace.   Deep breathing. To do this, expand your stomach and inhale slowly through your nose. Hold your breath for 3-5 seconds. Then exhale slowly, allowing your stomach muscles to relax.   Self-talk. This is a skill where you identify thought patterns that lead to anxiety reactions and correct those thoughts.   Muscle relaxation. This involves tensing muscles then relaxing them.    Choose a stress reduction technique that fits your lifestyle and personality. Stress reduction techniques take time and practice. Set aside 5-15 minutes a day to do them. Therapists can offer training in these techniques. The training may be covered by some insurance plans. Other things you can do to manage stress include:   Keeping a  stress diary. This can help you learn what triggers your stress and ways to control your response.   Thinking about how you respond to certain situations. You may not be able to control everything, but you can control your reaction.   Making time for activities that help you relax, and not feeling guilty about spending your time in this way.    Therapy combined with coping and stress-reduction skills provides the best chance for successful treatment.  Medicines  Medicines can help ease symptoms. Medicines for anxiety include:   Anti-anxiety drugs.   Antidepressants.   Beta-blockers.    Medicines may be used as the main treatment for anxiety disorder, along with therapy, or if other treatments are not working. Medicines should be prescribed by a health care provider.  Relationships  Relationships can play a big part in helping you recover. Try to spend more time connecting with trusted friends and family members. Consider going to couples counseling, taking family education classes, or going to family therapy. Therapy can help you and others better understand the condition.  How to recognize changes in your condition  Everyone has a different response to treatment for anxiety. Recovery from anxiety happens when symptoms decrease and stop interfering with your daily activities at home or work. This may mean that you will start to:   Have better concentration and focus.   Sleep better.   Be less irritable.   Have more energy.   Have improved   memory.    It is important to recognize when your condition is getting worse. Contact your health care provider if your symptoms interfere with home or work and you do not feel like your condition is improving.  Where to find help and support:  You can get help and support from these sources:   Self-help groups.   Online and community organizations.   A trusted spiritual leader.   Couples counseling.   Family education classes.   Family therapy.    Follow these  instructions at home:   Eat a healthy diet that includes plenty of vegetables, fruits, whole grains, low-fat dairy products, and lean protein. Do not eat a lot of foods that are high in solid fats, added sugars, or salt.   Exercise. Most adults should do the following:  ? Exercise for at least 150 minutes each week. The exercise should increase your heart rate and make you sweat (moderate-intensity exercise).  ? Strengthening exercises at least twice a week.   Cut down on caffeine, tobacco, alcohol, and other potentially harmful substances.   Get the right amount and quality of sleep. Most adults need 7-9 hours of sleep each night.   Make choices that simplify your life.   Take over-the-counter and prescription medicines only as told by your health care provider.   Avoid caffeine, alcohol, and certain over-the-counter cold medicines. These may make you feel worse. Ask your pharmacist which medicines to avoid.   Keep all follow-up visits as told by your health care provider. This is important.  Questions to ask your health care provider   Would I benefit from therapy?   How often should I follow up with a health care provider?   How long do I need to take medicine?   Are there any long-term side effects of my medicine?   Are there any alternatives to taking medicine?  Contact a health care provider if:   You have a hard time staying focused or finishing daily tasks.   You spend many hours a day feeling worried about everyday life.   You become exhausted by worry.   You start to have headaches, feel tense, or have nausea.   You urinate more than normal.   You have diarrhea.  Get help right away if:   You have a racing heart and shortness of breath.   You have thoughts of hurting yourself or others.  If you ever feel like you may hurt yourself or others, or have thoughts about taking your own life, get help right away. You can go to your nearest emergency department or call:   Your local emergency  services (911 in the [place]   A suicide crisis helpline, such as the National Suicide Prevention Lifeline at 1-800-273-8255. This is open 24-hours a day.    Summary   Taking steps to deal with stress can help calm you.   Medicines cannot cure anxiety disorders, but they can help ease symptoms.   Family, friends, and partners can play a big part in helping you recover from an anxiety disorder.  This information is not intended to replace advice given to you by your health care provider. Make sure you discuss any questions you have with your health care provider.  Document Released: 03/10/2016 Document Revised: 03/10/2016 Document Reviewed: 03/10/2016  Elsevier Interactive Patient Education  2018 [employer]      Living With Depression  Everyone experiences occasional disappointment, sadness, and loss in their lives. When you are feeling   down, blue, or sad for at least 2 weeks in a row, it may mean that you have depression. Depression can affect your thoughts and feelings, relationships, daily activities, and physical health. It is caused by changes in the way your brain functions. If you receive a diagnosis of depression, your health care provider will tell you which type of depression you have and what treatment options are available to you.  If you are living with depression, there are ways to help you recover from it and also ways to prevent it from coming back.  How to cope with lifestyle changes  Coping with stress  Stress is your body's reaction to life changes and events, both good and bad. Stressful situations may include:   Getting married.   The death of a spouse.   Losing a job.   Retiring.   Having a baby.    Stress can last just a few hours or it can be ongoing. Stress can play a major role in depression, so it is important to learn both how to cope with stress and how to think about it differently.  Talk with your health care provider or a counselor if you would like to learn more about  stress reduction. He or she may suggest some stress reduction techniques, such as:   Music therapy. This can include creating music or listening to music. Choose music that you enjoy and that inspires you.   Mindfulness-based meditation. This kind of meditation can be done while sitting or walking. It involves being aware of your normal breaths, rather than trying to control your breathing.   Centering prayer. This is a kind of meditation that involves focusing on a spiritual word or phrase. Choose a word, phrase, or sacred image that is meaningful to you and that brings you peace.   Deep breathing. To do this, expand your stomach and inhale slowly through your nose. Hold your breath for 3-5 seconds, then exhale slowly, allowing your stomach muscles to relax.   Muscle relaxation. This involves intentionally tensing muscles then relaxing them.    Choose a stress reduction technique that fits your lifestyle and personality. Stress reduction techniques take time and practice to develop. Set aside 5-15 minutes a day to do them. Therapists can offer training in these techniques. The training may be covered by some insurance plans. Other things you can do to manage stress include:   Keeping a stress diary. This can help you learn what triggers your stress and ways to control your response.   Understanding what your limits are and saying no to requests or events that lead to a schedule that is too full.   Thinking about how you respond to certain situations. You may not be able to control everything, but you can control how you react.   Adding humor to your life by watching funny films or TV shows.   Making time for activities that help you relax and not feeling guilty about spending your time this way.    Medicines  Your health care provider may suggest certain medicines if he or she feels that they will help improve your condition. Avoid using alcohol and other substances that may prevent your medicines from  working properly (may interact). It is also important to:   Talk with your pharmacist or health care provider about all the medicines that you take, their possible side effects, and what medicines are safe to take together.   Make it your goal to take part   in all treatment decisions (shared decision-making). This includes giving input on the side effects of medicines. It is best if shared decision-making with your health care provider is part of your total treatment plan.    If your health care provider prescribes a medicine, you may not notice the full benefits of it for 4-8 weeks. Most people who are treated for depression need to be on medicine for at least 6-12 months after they feel better. If you are taking medicines as part of your treatment, do not stop taking medicines without first talking to your health care provider. You may need to have the medicine slowly decreased (tapered) over time to decrease the risk of harmful side effects.  Relationships  Your health care provider may suggest family therapy along with individual therapy and drug therapy. While there may not be family problems that are causing you to feel depressed, it is still important to make sure your family learns as much as they can about your mental health. Having your family's support can help make your treatment successful.  How to recognize changes in your condition  Everyone has a different response to treatment for depression. Recovery from major depression happens when you have not had signs of major depression for two months. This may mean that you will start to:   Have more interest in doing activities.   Feel less hopeless than you did 2 months ago.   Have more energy.   Overeat less often, or have better or improving appetite.   Have better concentration.    Your health care provider will work with you to decide the next steps in your recovery. It is also important to recognize when your condition is getting worse. Watch  for these signs:   Having fatigue or low energy.   Eating too much or too little.   Sleeping too much or too little.   Feeling restless, agitated, or hopeless.   Having trouble concentrating or making decisions.   Having unexplained physical complaints.   Feeling irritable, angry, or aggressive.    Get help as soon as you or your family members notice these symptoms coming back.  How to get support and help from others  How to talk with friends and family members about your condition  Talking to friends and family members about your condition can provide you with one way to get support and guidance. Reach out to trusted friends or family members, explain your symptoms to them, and let them know that you are working with a health care provider to treat your depression.  Financial resources  Not all insurance plans cover mental health care, so it is important to check with your insurance carrier. If paying for co-pays or counseling services is a problem, search for a local or county mental health care center. They may be able to offer public mental health care services at low or no cost when you are not able to see a private health care provider.  If you are taking medicine for depression, you may be able to get the generic form, which may be less expensive. Some makers of prescription medicines also offer help to patients who cannot afford the medicines they need.  Follow these instructions at home:   Get the right amount and quality of sleep.   Cut down on using caffeine, tobacco, alcohol, and other potentially harmful substances.   Try to exercise, such as walking or lifting small weights.   Take over-the-counter and prescription medicines   only as told by your health care provider.   Eat a healthy diet that includes plenty of vegetables, fruits, whole grains, low-fat dairy products, and lean protein. Do not eat a lot of foods that are high in solid fats, added sugars, or salt.   Keep all follow-up  visits as told by your health care provider. This is important.  Contact a health care provider if:   You stop taking your antidepressant medicines, and you have any of these symptoms:  ? Nausea.  ? Headache.  ? Feeling lightheaded.  ? Chills and body aches.  ? Not being able to sleep (insomnia).   You or your friends and family think your depression is getting worse.  Get help right away if:   You have thoughts of hurting yourself or others.  If you ever feel like you may hurt yourself or others, or have thoughts about taking your own life, get help right away. You can go to your nearest emergency department or call:   Your local emergency services (911 in the [place]   A suicide crisis helpline, such as the National Suicide Prevention Lifeline at 1-800-273-8255. This is open 24-hours a day.    Summary   If you are living with depression, there are ways to help you recover from it and also ways to prevent it from coming back.   Work with your health care team to create a management plan that includes counseling, stress management techniques, and healthy lifestyle habits.  This information is not intended to replace advice given to you by your health care provider. Make sure you discuss any questions you have with your health care provider.  Document Released: 02/17/2016 Document Revised: 02/17/2016 Document Reviewed: 02/17/2016  Elsevier Interactive Patient Education  2018 [employer] Inc.

## 2017-07-08 MED FILL — AMPHETAMINE SALTS 30 MG TAB: 30 | 30 days supply | Qty: 90 | Fill #0

## 2017-07-08 MED FILL — CLOMIPHENE CITRATE 50 MG TA: 50 | 30 days supply | Qty: 8 | Fill #0

## 2017-07-08 MED FILL — clonazePAM 2 MG TABS: 2 | 90 days supply | Qty: 90 | Fill #0

## 2017-07-15 DIAGNOSIS — F4310 Post-traumatic stress disorder, unspecified: Secondary | ICD-10-CM | POA: Diagnosis not present

## 2017-07-21 ENCOUNTER — Encounter: Payer: Self-pay | Admitting: Family Medicine

## 2017-07-21 ENCOUNTER — Ambulatory Visit (INDEPENDENT_AMBULATORY_CARE_PROVIDER_SITE_OTHER): Payer: 59 | Admitting: Family Medicine

## 2017-07-21 VITALS — BP 144/80 | HR 77 | Temp 97.5°F | Ht 67.0 in | Wt 147.7 lb

## 2017-07-21 DIAGNOSIS — F339 Major depressive disorder, recurrent, unspecified: Secondary | ICD-10-CM | POA: Diagnosis not present

## 2017-07-21 DIAGNOSIS — Z0000 Encounter for general adult medical examination without abnormal findings: Secondary | ICD-10-CM

## 2017-07-21 DIAGNOSIS — Z13220 Encounter for screening for lipoid disorders: Secondary | ICD-10-CM

## 2017-07-21 LAB — CBC
HCT: 41.6 % (ref 39.0–52.0)
HEMOGLOBIN: 14.1 g/dL (ref 13.0–17.0)
MCHC: 33.9 g/dL (ref 30.0–36.0)
MCV: 96.9 fl (ref 78.0–100.0)
PLATELETS: 205 10*3/uL (ref 150.0–400.0)
RBC: 4.3 Mil/uL (ref 4.22–5.81)
RDW: 13.2 % (ref 11.5–15.5)
WBC: 7.3 10*3/uL (ref 4.0–10.5)

## 2017-07-21 LAB — LIPID PANEL
CHOLESTEROL: 111 mg/dL (ref 0–200)
HDL: 72.6 mg/dL (ref >39.00)
LDL Cholesterol: 30 mg/dL (ref 0–99)
NONHDL: 38.73
Total CHOL/HDL Ratio: 2
Triglycerides: 42 mg/dL (ref 0.0–149.0)
VLDL: 8.4 mg/dL (ref 0.0–40.0)

## 2017-07-21 LAB — BASIC METABOLIC PANEL
BUN: 25 mg/dL — AB (ref 6–23)
CO2: 28 meq/L (ref 19–32)
CREATININE: 0.81 mg/dL (ref 0.40–1.50)
Calcium: 9.7 mg/dL (ref 8.4–10.5)
Chloride: 107 mEq/L (ref 96–112)
GFR: 113.57 mL/min (ref >60.00)
Glucose, Bld: 80 mg/dL (ref 70–99)
Potassium: 4.6 mEq/L (ref 3.5–5.1)
Sodium: 140 mEq/L (ref 135–145)

## 2017-07-21 LAB — TSH: TSH: 3.17 u[IU]/mL (ref 0.35–4.50)

## 2017-07-21 NOTE — Progress Notes (Signed)
Subjective:     Zachary Dean is a 37 y.o. male and is here for a comprehensive physical exam. The patient reports no problems.  Pt states he is glad the weather is warmer.  Social History   Socioeconomic History  . Marital status: Married    Spouse name: Not on file  . Number of children: Not on file  . Years of education: Not on file  . Highest education level: Not on file  Occupational History  . Not on file  Social Needs  . Financial resource strain: Not on file  . Food insecurity:    Worry: Not on file    Inability: Not on file  . Transportation needs:    Medical: Not on file    Non-medical: Not on file  Tobacco Use  . Smoking status: Current Every Day Smoker    Packs/day: 1.00    Years: 5.00    Pack years: 5.00    Types: Cigarettes  . Smokeless tobacco: Never Used  Substance and Sexual Activity  . Alcohol use: No  . Drug use: No  . Sexual activity: Yes  Lifestyle  . Physical activity:    Days per week: Not on file    Minutes per session: Not on file  . Stress: Not on file  Relationships  . Social connections:    Talks on phone: Not on file    Gets together: Not on file    Attends religious service: Not on file    Active member of club or organization: Not on file    Attends meetings of clubs or organizations: Not on file    Relationship status: Not on file  . Intimate partner violence:    Fear of current or ex partner: Not on file    Emotionally abused: Not on file    Physically abused: Not on file    Forced sexual activity: Not on file  Other Topics Concern  . Not on file  Social History Narrative  . Not on file   Health Maintenance  Topic Date Due  . HIV Screening  11/29/1994  . INFLUENZA VACCINE  10/28/2017  . TETANUS/TDAP  04/24/2027    The following portions of the patient's history were reviewed and updated as appropriate: allergies, current medications, past family history, past medical history, past social history, past  surgical history and problem list.  Review of Systems A comprehensive review of systems was negative.   Objective:    BP (!) 144/80 (BP Location: Left Arm, Patient Position: Sitting, Cuff Size: Normal)   Pulse 77   Temp (!) 97.5 F (36.4 C) (Oral)   Ht 5\' 7"  (1.702 m)   Wt 147 lb 11.2 oz (67 kg)   SpO2 97%   BMI 23.13 kg/m  General appearance: alert, cooperative and no distress Head: Normocephalic, without obvious abnormality, atraumatic Eyes: conjunctivae/corneas clear. PERRL, EOM's intact. Fundi benign. Ears: normal TM's and external ear canals both ears Nose: Nares normal. Septum midline. Mucosa normal. No drainage or sinus tenderness. Throat: lips, mucosa, and tongue normal; teeth and gums normal Neck: no adenopathy, no carotid bruit, no JVD, supple, symmetrical, trachea midline and thyroid not enlarged, symmetric, no tenderness/mass/nodules Lungs: clear to auscultation bilaterally Heart: regular rate and rhythm, S1, S2 normal, no murmur, click, rub or gallop Abdomen: soft, non-tender; bowel sounds normal; no masses,  no organomegaly Male genitalia: deferred Extremities: extremities normal, atraumatic, no cyanosis or edema Skin: Skin color, texture, turgor normal. No rashes or lesions Neurologic:  Alert and oriented X 3, normal strength and tone. Normal symmetric reflexes. Normal coordination and gait    Assessment:    Healthy male exam.      Plan:      Anticipatory guidance given including wearing seatbelts, smoke detectors in the home, increasing physical activity, increasing p.o. intake of water and vegetables. -We will obtain labs this visit -Next CPE in 1 year See After Visit Summary for Counseling Recommendations    Screening for cholesterol -We will obtain lipid panel this visit  MDD/anxiety/PTSD/Psychosis -Continue following with psychiatry, Dr. Rexene Edison -Continue taking Adderall 30 mg 3 times daily, Klonopin 2 mg, Vyvanse 70 mg daily, lithium carbonate 450 mg,  Trintellix 20 mg daily, gabapentin 600 mg TID, levothyroxine 25 mcg as an adjunct as prescribed by Dr. Rexene Edison.  F/u prn in the next few months.  Grier Mitts, MD

## 2017-07-21 NOTE — Patient Instructions (Signed)
Preventive Care 18-39 Years, Male Preventive care refers to lifestyle choices and visits with your health care provider that can promote health and wellness. What does preventive care include?  A yearly physical exam. This is also called an annual well check.  Dental exams once or twice a year.  Routine eye exams. Ask your health care provider how often you should have your eyes checked.  Personal lifestyle choices, including: ? Daily care of your teeth and gums. ? Regular physical activity. ? Eating a healthy diet. ? Avoiding tobacco and drug use. ? Limiting alcohol use. ? Practicing safe sex. What happens during an annual well check? The services and screenings done by your health care provider during your annual well check will depend on your age, overall health, lifestyle risk factors, and family history of disease. Counseling Your health care provider may ask you questions about your:  Alcohol use.  Tobacco use.  Drug use.  Emotional well-being.  Home and relationship well-being.  Sexual activity.  Eating habits.  Work and work Statistician.  Screening You may have the following tests or measurements:  Height, weight, and BMI.  Blood pressure.  Lipid and cholesterol levels. These may be checked every 5 years starting at age 34.  Diabetes screening. This is done by checking your blood sugar (glucose) after you have not eaten for a while (fasting).  Skin check.  Hepatitis C blood test.  Hepatitis B blood test.  Sexually transmitted disease (STD) testing.  Discuss your test results, treatment options, and if necessary, the need for more tests with your health care provider. Vaccines Your health care provider may recommend certain vaccines, such as:  Influenza vaccine. This is recommended every year.  Tetanus, diphtheria, and acellular pertussis (Tdap, Td) vaccine. You may need a Td booster every 10 years.  Varicella vaccine. You may need this if you  have not been vaccinated.  HPV vaccine. If you are 23 or younger, you may need three doses over 6 months.  Measles, mumps, and rubella (MMR) vaccine. You may need at least one dose of MMR.You may also need a second dose.  Pneumococcal 13-valent conjugate (PCV13) vaccine. You may need this if you have certain conditions and have not been vaccinated.  Pneumococcal polysaccharide (PPSV23) vaccine. You may need one or two doses if you smoke cigarettes or if you have certain conditions.  Meningococcal vaccine. One dose is recommended if you are age 65-21 years and a first-year college student living in a residence hall, or if you have one of several medical conditions. You may also need additional booster doses.  Hepatitis A vaccine. You may need this if you have certain conditions or if you travel or work in places where you may be exposed to hepatitis A.  Hepatitis B vaccine. You may need this if you have certain conditions or if you travel or work in places where you may be exposed to hepatitis B.  Haemophilus influenzae type b (Hib) vaccine. You may need this if you have certain risk factors.  Talk to your health care provider about which screenings and vaccines you need and how often you need them. This information is not intended to replace advice given to you by your health care provider. Make sure you discuss any questions you have with your health care provider. Document Released: 05/12/2001 Document Revised: 12/04/2015 Document Reviewed: 01/15/2015 Elsevier Interactive Patient Education  [employer].

## 2017-07-26 MED FILL — ARMODAFINIL 250 MG TABLET: 250 | 30 days supply | Qty: 30 | Fill #0

## 2017-07-26 MED FILL — LITHIUM ER 450 MG TABLET: 450 | 90 days supply | Qty: 90 | Fill #1

## 2017-07-26 MED FILL — VENLAFAXINE HCL ER 150 MG C: 150 | 90 days supply | Qty: 180 | Fill #0

## 2017-07-27 ENCOUNTER — Ambulatory Visit (INDEPENDENT_AMBULATORY_CARE_PROVIDER_SITE_OTHER): Payer: 59 | Admitting: Urology

## 2017-07-27 DIAGNOSIS — N4611 Organic oligospermia: Secondary | ICD-10-CM

## 2017-07-27 DIAGNOSIS — N5201 Erectile dysfunction due to arterial insufficiency: Secondary | ICD-10-CM

## 2017-07-28 ENCOUNTER — Encounter: Payer: Self-pay | Admitting: Family Medicine

## 2017-07-29 DIAGNOSIS — F4310 Post-traumatic stress disorder, unspecified: Secondary | ICD-10-CM | POA: Diagnosis not present

## 2017-08-09 MED FILL — AMPHETAMINE SALTS 30 MG TAB: 30 | 30 days supply | Qty: 90 | Fill #0

## 2017-08-09 MED FILL — GABAPENTIN 600 MG TAB: 600 | 90 days supply | Qty: 540 | Fill #2

## 2017-08-09 MED FILL — LEVOTHYROXINE 25 MCG TABLET: 25 | 90 days supply | Qty: 90 | Fill #1

## 2017-08-17 DIAGNOSIS — F332 Major depressive disorder, recurrent severe without psychotic features: Secondary | ICD-10-CM | POA: Diagnosis not present

## 2017-08-18 DIAGNOSIS — F332 Major depressive disorder, recurrent severe without psychotic features: Secondary | ICD-10-CM | POA: Diagnosis not present

## 2017-08-19 DIAGNOSIS — F332 Major depressive disorder, recurrent severe without psychotic features: Secondary | ICD-10-CM | POA: Diagnosis not present

## 2017-08-24 DIAGNOSIS — F332 Major depressive disorder, recurrent severe without psychotic features: Secondary | ICD-10-CM | POA: Diagnosis not present

## 2017-08-24 MED FILL — ARMODAFINIL 250 MG TABLET: 250 | 30 days supply | Qty: 30 | Fill #1

## 2017-08-25 DIAGNOSIS — F4310 Post-traumatic stress disorder, unspecified: Secondary | ICD-10-CM | POA: Diagnosis not present

## 2017-08-26 DIAGNOSIS — F332 Major depressive disorder, recurrent severe without psychotic features: Secondary | ICD-10-CM | POA: Diagnosis not present

## 2017-09-07 DIAGNOSIS — F332 Major depressive disorder, recurrent severe without psychotic features: Secondary | ICD-10-CM | POA: Diagnosis not present

## 2017-09-07 MED FILL — AMPHETAMINE SALTS 30 MG TAB: 30 | 30 days supply | Qty: 90 | Fill #0

## 2017-09-09 DIAGNOSIS — F332 Major depressive disorder, recurrent severe without psychotic features: Secondary | ICD-10-CM | POA: Diagnosis not present

## 2017-09-13 DIAGNOSIS — F4310 Post-traumatic stress disorder, unspecified: Secondary | ICD-10-CM | POA: Diagnosis not present

## 2017-09-13 DIAGNOSIS — F341 Dysthymic disorder: Secondary | ICD-10-CM | POA: Diagnosis not present

## 2017-09-14 DIAGNOSIS — F332 Major depressive disorder, recurrent severe without psychotic features: Secondary | ICD-10-CM | POA: Diagnosis not present

## 2017-09-17 DIAGNOSIS — F332 Major depressive disorder, recurrent severe without psychotic features: Secondary | ICD-10-CM | POA: Diagnosis not present

## 2017-09-21 DIAGNOSIS — F332 Major depressive disorder, recurrent severe without psychotic features: Secondary | ICD-10-CM | POA: Diagnosis not present

## 2017-09-21 MED FILL — clonazePAM 2 MG TABS: 2 | 90 days supply | Qty: 180 | Fill #0

## 2017-10-05 DIAGNOSIS — F332 Major depressive disorder, recurrent severe without psychotic features: Secondary | ICD-10-CM | POA: Diagnosis not present

## 2017-10-05 MED FILL — AMPHETAMINE SALTS 30 MG TAB: 30 | 30 days supply | Qty: 90 | Fill #0

## 2017-10-12 DIAGNOSIS — F332 Major depressive disorder, recurrent severe without psychotic features: Secondary | ICD-10-CM | POA: Diagnosis not present

## 2017-10-12 MED FILL — SILDENAFIL CITRATE 100 MG T: 100 | 90 days supply | Qty: 18 | Fill #3

## 2017-10-26 ENCOUNTER — Ambulatory Visit: Payer: Self-pay | Admitting: Urology

## 2017-10-26 DIAGNOSIS — F332 Major depressive disorder, recurrent severe without psychotic features: Secondary | ICD-10-CM | POA: Diagnosis not present

## 2017-11-02 DIAGNOSIS — F3342 Major depressive disorder, recurrent, in full remission: Secondary | ICD-10-CM | POA: Diagnosis not present

## 2017-11-03 MED FILL — DEXTROAMPH TB 30MG NSTR 100: 30 | 30 days supply | Qty: 90 | Fill #0

## 2017-11-09 DIAGNOSIS — F332 Major depressive disorder, recurrent severe without psychotic features: Secondary | ICD-10-CM | POA: Diagnosis not present

## 2017-11-16 DIAGNOSIS — F332 Major depressive disorder, recurrent severe without psychotic features: Secondary | ICD-10-CM | POA: Diagnosis not present

## 2017-11-16 MED FILL — TADALAFIL 10 MG TABS: 10 | 90 days supply | Qty: 18 | Fill #0

## 2017-11-19 MED FILL — TRINTELLIX 20 MG TABLET: 20 | 90 days supply | Qty: 90 | Fill #0

## 2017-11-23 DIAGNOSIS — F332 Major depressive disorder, recurrent severe without psychotic features: Secondary | ICD-10-CM | POA: Diagnosis not present

## 2017-11-30 DIAGNOSIS — F332 Major depressive disorder, recurrent severe without psychotic features: Secondary | ICD-10-CM | POA: Diagnosis not present

## 2017-12-03 MED FILL — LITHIUM ER 450 MG TABLET: 450 | 90 days supply | Qty: 90 | Fill #2

## 2017-12-03 MED FILL — LEVOTHYROXINE 25 MCG TABLET: 25 | 90 days supply | Qty: 90 | Fill #2

## 2017-12-06 MED FILL — DEXTROAMPH TB 30MG NSTR 100: 30 | 30 days supply | Qty: 90 | Fill #0

## 2017-12-07 DIAGNOSIS — F332 Major depressive disorder, recurrent severe without psychotic features: Secondary | ICD-10-CM | POA: Diagnosis not present

## 2017-12-14 DIAGNOSIS — F332 Major depressive disorder, recurrent severe without psychotic features: Secondary | ICD-10-CM | POA: Diagnosis not present

## 2017-12-20 MED FILL — clonazePAM 2 MG TABS: 2 | 90 days supply | Qty: 180 | Fill #1

## 2017-12-21 DIAGNOSIS — F332 Major depressive disorder, recurrent severe without psychotic features: Secondary | ICD-10-CM | POA: Diagnosis not present

## 2017-12-27 MED FILL — VENLAFAXINE HCL ER 150 MG C: 150 | 90 days supply | Qty: 180 | Fill #1

## 2017-12-28 DIAGNOSIS — F332 Major depressive disorder, recurrent severe without psychotic features: Secondary | ICD-10-CM | POA: Diagnosis not present

## 2018-01-04 DIAGNOSIS — F332 Major depressive disorder, recurrent severe without psychotic features: Secondary | ICD-10-CM | POA: Diagnosis not present

## 2018-01-10 MED FILL — AMPHETAMINE SALTS 30 MG TAB: 30 | 30 days supply | Qty: 90 | Fill #0

## 2018-01-11 DIAGNOSIS — F332 Major depressive disorder, recurrent severe without psychotic features: Secondary | ICD-10-CM | POA: Diagnosis not present

## 2018-01-11 MED FILL — rOPINIRole HCL 1 MG TABS: 1 | 90 days supply | Qty: 90 | Fill #0

## 2018-01-18 DIAGNOSIS — F332 Major depressive disorder, recurrent severe without psychotic features: Secondary | ICD-10-CM | POA: Diagnosis not present

## 2018-01-19 MED FILL — VERAPAMIL ER 180 MG TABLET: 180 | 90 days supply | Qty: 90 | Fill #0

## 2018-01-25 DIAGNOSIS — F332 Major depressive disorder, recurrent severe without psychotic features: Secondary | ICD-10-CM | POA: Diagnosis not present

## 2018-01-25 MED FILL — VERAPAMIL ER 240 MG TABLET: 240 | 90 days supply | Qty: 90 | Fill #0

## 2018-01-25 MED FILL — traZODone HCL 50 MG TABS: 50 | 90 days supply | Qty: 360 | Fill #0

## 2018-02-01 DIAGNOSIS — F332 Major depressive disorder, recurrent severe without psychotic features: Secondary | ICD-10-CM | POA: Diagnosis not present

## 2018-02-01 MED FILL — TADALAFIL 10 MG TABS: 10 | 90 days supply | Qty: 18 | Fill #1

## 2018-02-08 DIAGNOSIS — F332 Major depressive disorder, recurrent severe without psychotic features: Secondary | ICD-10-CM | POA: Diagnosis not present

## 2018-02-09 MED FILL — SILDENAFIL CITRATE 100 MG T: 100 | 30 days supply | Qty: 6 | Fill #0

## 2018-02-15 DIAGNOSIS — F332 Major depressive disorder, recurrent severe without psychotic features: Secondary | ICD-10-CM | POA: Diagnosis not present

## 2018-02-16 MED FILL — AMPHETAMINE SALTS 30 MG TAB: 30 | 30 days supply | Qty: 90 | Fill #0

## 2018-02-22 DIAGNOSIS — F332 Major depressive disorder, recurrent severe without psychotic features: Secondary | ICD-10-CM | POA: Diagnosis not present

## 2018-02-22 MED FILL — LOSARTAN POTASSIUM 50 MG TA: 50 | 30 days supply | Qty: 30 | Fill #0

## 2018-03-01 DIAGNOSIS — F332 Major depressive disorder, recurrent severe without psychotic features: Secondary | ICD-10-CM | POA: Diagnosis not present

## 2018-03-09 DIAGNOSIS — F3342 Major depressive disorder, recurrent, in full remission: Secondary | ICD-10-CM | POA: Diagnosis not present

## 2018-03-09 MED FILL — SILDENAFIL CITRATE 100 MG T: 100 | 30 days supply | Qty: 6 | Fill #1

## 2018-03-09 MED FILL — LEVOTHYROXINE 25 MCG TABLET: 25 | 90 days supply | Qty: 90 | Fill #3

## 2018-03-15 DIAGNOSIS — F332 Major depressive disorder, recurrent severe without psychotic features: Secondary | ICD-10-CM | POA: Diagnosis not present

## 2018-03-29 DIAGNOSIS — F332 Major depressive disorder, recurrent severe without psychotic features: Secondary | ICD-10-CM | POA: Diagnosis not present

## 2018-03-31 MED FILL — LITHIUM ER 450 MG TABLET: 450 | 90 days supply | Qty: 90 | Fill #3

## 2018-03-31 MED FILL — VENLAFAXINE HCL ER 150 MG C: 150 | 90 days supply | Qty: 180 | Fill #2

## 2018-04-05 DIAGNOSIS — F332 Major depressive disorder, recurrent severe without psychotic features: Secondary | ICD-10-CM | POA: Diagnosis not present

## 2018-04-14 MED FILL — SILDENAFIL CITRATE 100 MG T: 100 | 30 days supply | Qty: 6 | Fill #2

## 2018-04-14 MED FILL — clonazePAM 2 MG TABS: 2 | 30 days supply | Qty: 90 | Fill #0

## 2018-05-04 MED FILL — AMPHETAMINE SALTS 30 MG TAB: 30 | 90 days supply | Qty: 270 | Fill #0

## 2018-05-24 MED FILL — TADALAFIL 10 MG TABS: 10 | 90 days supply | Qty: 18 | Fill #2

## 2018-06-08 DIAGNOSIS — F332 Major depressive disorder, recurrent severe without psychotic features: Secondary | ICD-10-CM | POA: Diagnosis not present

## 2018-06-08 MED FILL — clonazePAM 2 MG TABS: 2 | 30 days supply | Qty: 90 | Fill #1

## 2018-07-15 MED FILL — VERAPAMIL HCL ER 240 MG TBC: 240 | 90 days supply | Qty: 180 | Fill #0

## 2018-07-15 MED FILL — VENLAFAXINE HCL ER 150 MG C: 150 | 30 days supply | Qty: 60 | Fill #0

## 2018-07-20 MED FILL — clonazePAM 2 MG TABS: 2 | 90 days supply | Qty: 270 | Fill #0

## 2018-08-08 MED FILL — GABAPENTIN 600 MG TABLET: 600 | 30 days supply | Qty: 180 | Fill #0

## 2018-08-12 ENCOUNTER — Ambulatory Visit: Payer: 59 | Admitting: Psychiatry

## 2018-08-12 ENCOUNTER — Other Ambulatory Visit: Payer: Self-pay

## 2018-08-12 DIAGNOSIS — F332 Major depressive disorder, recurrent severe without psychotic features: Secondary | ICD-10-CM | POA: Diagnosis not present

## 2018-08-16 MED FILL — VENLAFAXINE HCL ER 150 MG C: 150 | 90 days supply | Qty: 180 | Fill #0

## 2018-08-26 MED FILL — SILDENAFIL CITRATE 100 MG T: 100 | 30 days supply | Qty: 6 | Fill #0

## 2018-09-06 MED FILL — AMPHETAMINE-DEXTROAMPHETAMI: 30 | 90 days supply | Qty: 270 | Fill #0

## 2018-09-08 DIAGNOSIS — F332 Major depressive disorder, recurrent severe without psychotic features: Secondary | ICD-10-CM | POA: Diagnosis not present

## 2018-09-24 MED FILL — SILDENAFIL CITRATE 100 MG T: 100 | 30 days supply | Qty: 6 | Fill #1

## 2018-09-28 MED FILL — GABAPENTIN 600 MG TABLET: 600 | 30 days supply | Qty: 180 | Fill #1

## 2018-10-20 MED FILL — LEVOTHYROXINE 25 MCG TABLET: 25 | 90 days supply | Qty: 90 | Fill #0

## 2018-10-23 MED FILL — SILDENAFIL CITRATE 100 MG T: 100 | 30 days supply | Qty: 6 | Fill #2

## 2018-11-04 DIAGNOSIS — F332 Major depressive disorder, recurrent severe without psychotic features: Secondary | ICD-10-CM | POA: Diagnosis not present

## 2018-11-09 MED FILL — clonazePAM 2 MG TABS: 2 | 90 days supply | Qty: 270 | Fill #0

## 2018-11-16 DIAGNOSIS — F332 Major depressive disorder, recurrent severe without psychotic features: Secondary | ICD-10-CM | POA: Diagnosis not present

## 2018-11-16 MED FILL — clonazePAM 2 MG TABS: 2 | 90 days supply | Qty: 270 | Fill #0

## 2018-11-28 DIAGNOSIS — F332 Major depressive disorder, recurrent severe without psychotic features: Secondary | ICD-10-CM | POA: Diagnosis not present

## 2018-11-29 ENCOUNTER — Ambulatory Visit: Payer: 59 | Admitting: Urology

## 2018-12-01 DIAGNOSIS — F332 Major depressive disorder, recurrent severe without psychotic features: Secondary | ICD-10-CM | POA: Diagnosis not present

## 2018-12-02 MED FILL — GABAPENTIN 600 MG TABLET: 600 | 30 days supply | Qty: 180 | Fill #2

## 2018-12-02 MED FILL — VERAPAMIL HCL ER 240 MG TBC: 240 | 90 days supply | Qty: 180 | Fill #1

## 2018-12-02 MED FILL — SILDENAFIL CITRATE 100 MG T: 100 | 30 days supply | Qty: 6 | Fill #3

## 2018-12-07 DIAGNOSIS — F332 Major depressive disorder, recurrent severe without psychotic features: Secondary | ICD-10-CM | POA: Diagnosis not present

## 2018-12-09 DIAGNOSIS — F332 Major depressive disorder, recurrent severe without psychotic features: Secondary | ICD-10-CM | POA: Diagnosis not present

## 2018-12-12 MED FILL — AMPHETAMINE-DEXTROAMPHETAMI: 30 | 90 days supply | Qty: 270 | Fill #0

## 2018-12-21 DIAGNOSIS — F332 Major depressive disorder, recurrent severe without psychotic features: Secondary | ICD-10-CM | POA: Diagnosis not present

## 2019-01-02 MED FILL — SILDENAFIL CITRATE 100 MG T: 100 | 30 days supply | Qty: 6 | Fill #4

## 2019-02-01 MED FILL — GABAPENTIN 600 MG TABLET: 600 | 30 days supply | Qty: 180 | Fill #0

## 2019-03-17 IMAGING — CR DG CHEST 2V
1 series · 2 of 2 positions shown · non-contrast
Comparison: 07/05/2006

CLINICAL DATA: Hypertension.  Preop ECT

EXAM:
CHEST  2 VIEW

[Series 1: dg chest 2 view · 0.14mm/px · 2 of 2 slices shown]
[im 1/2]
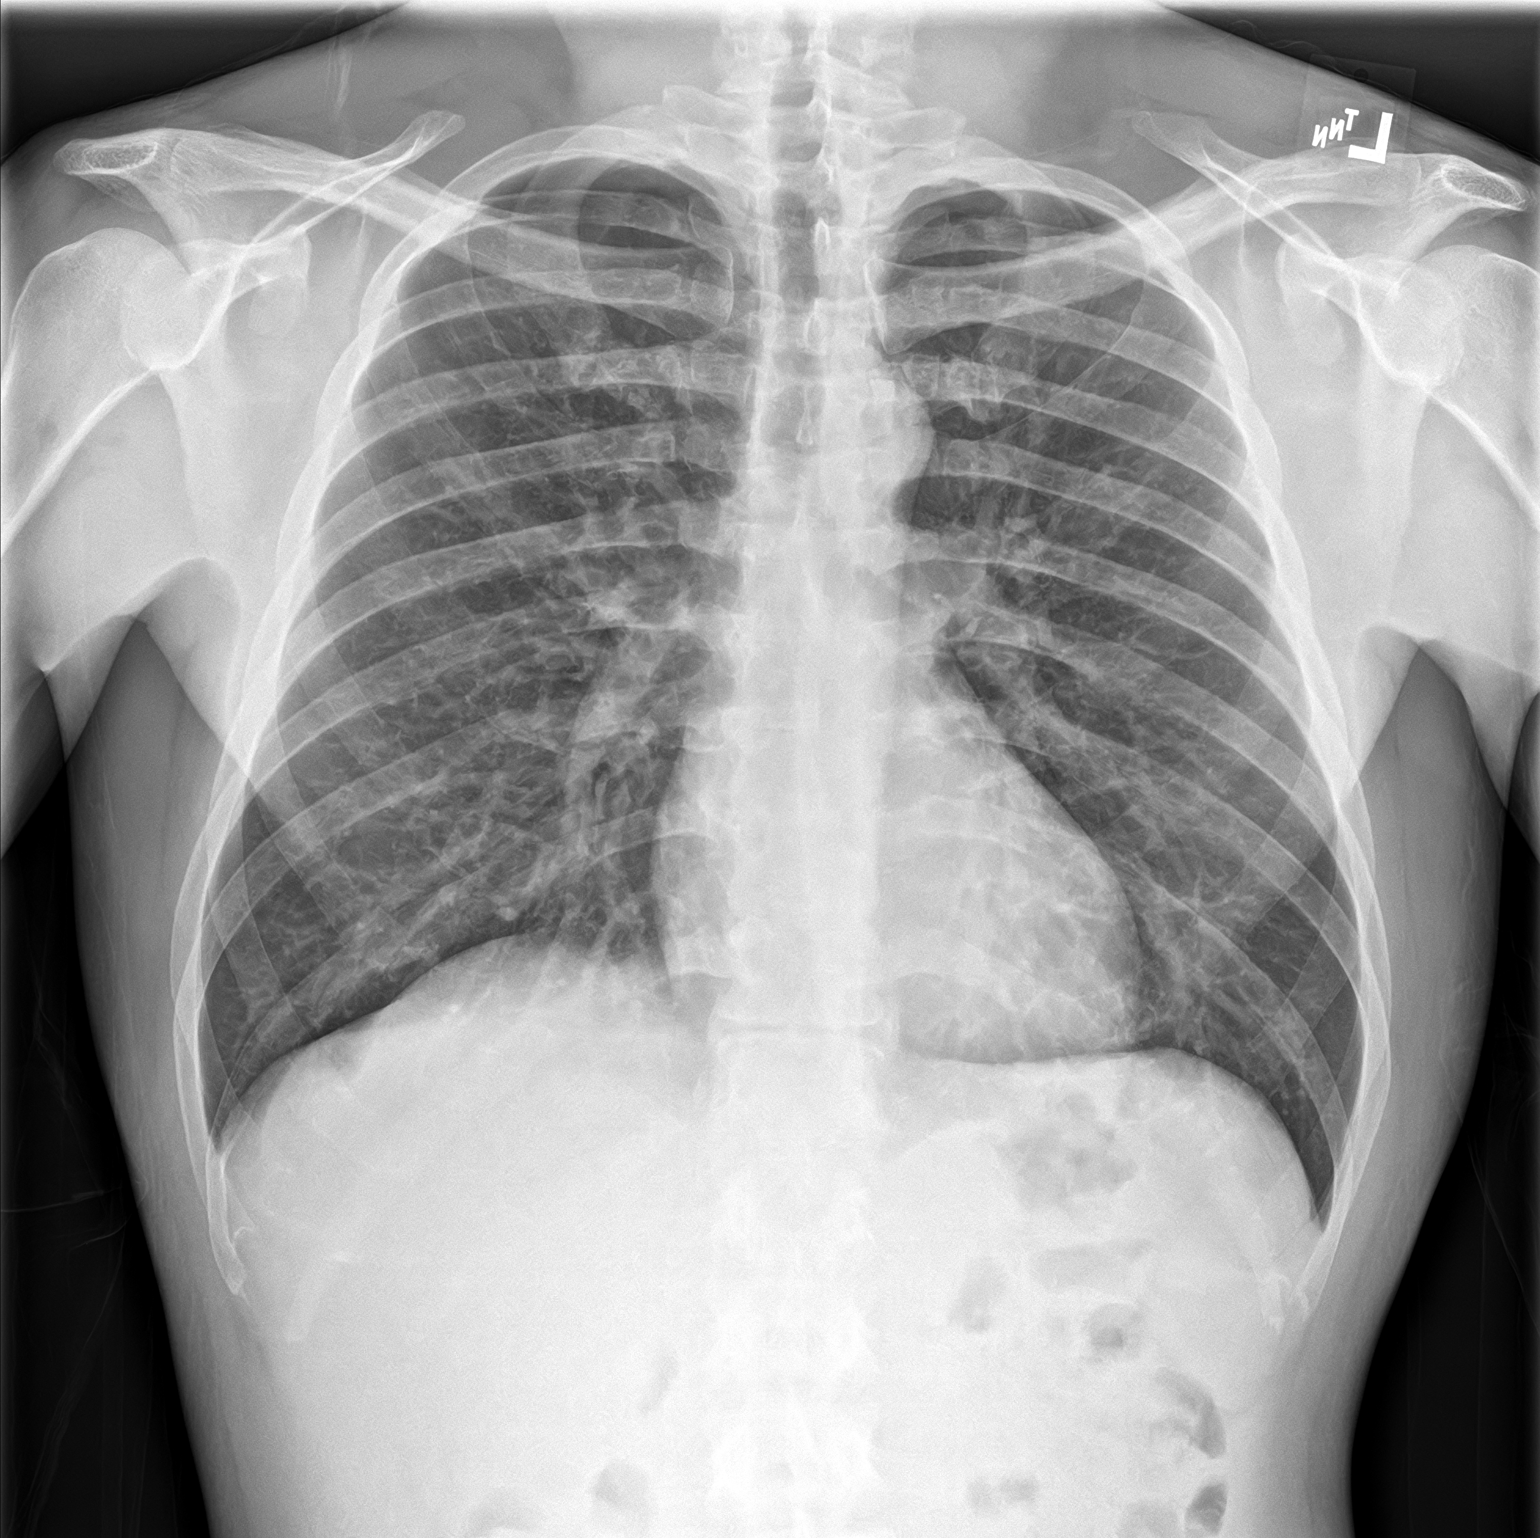
[im 2/2]
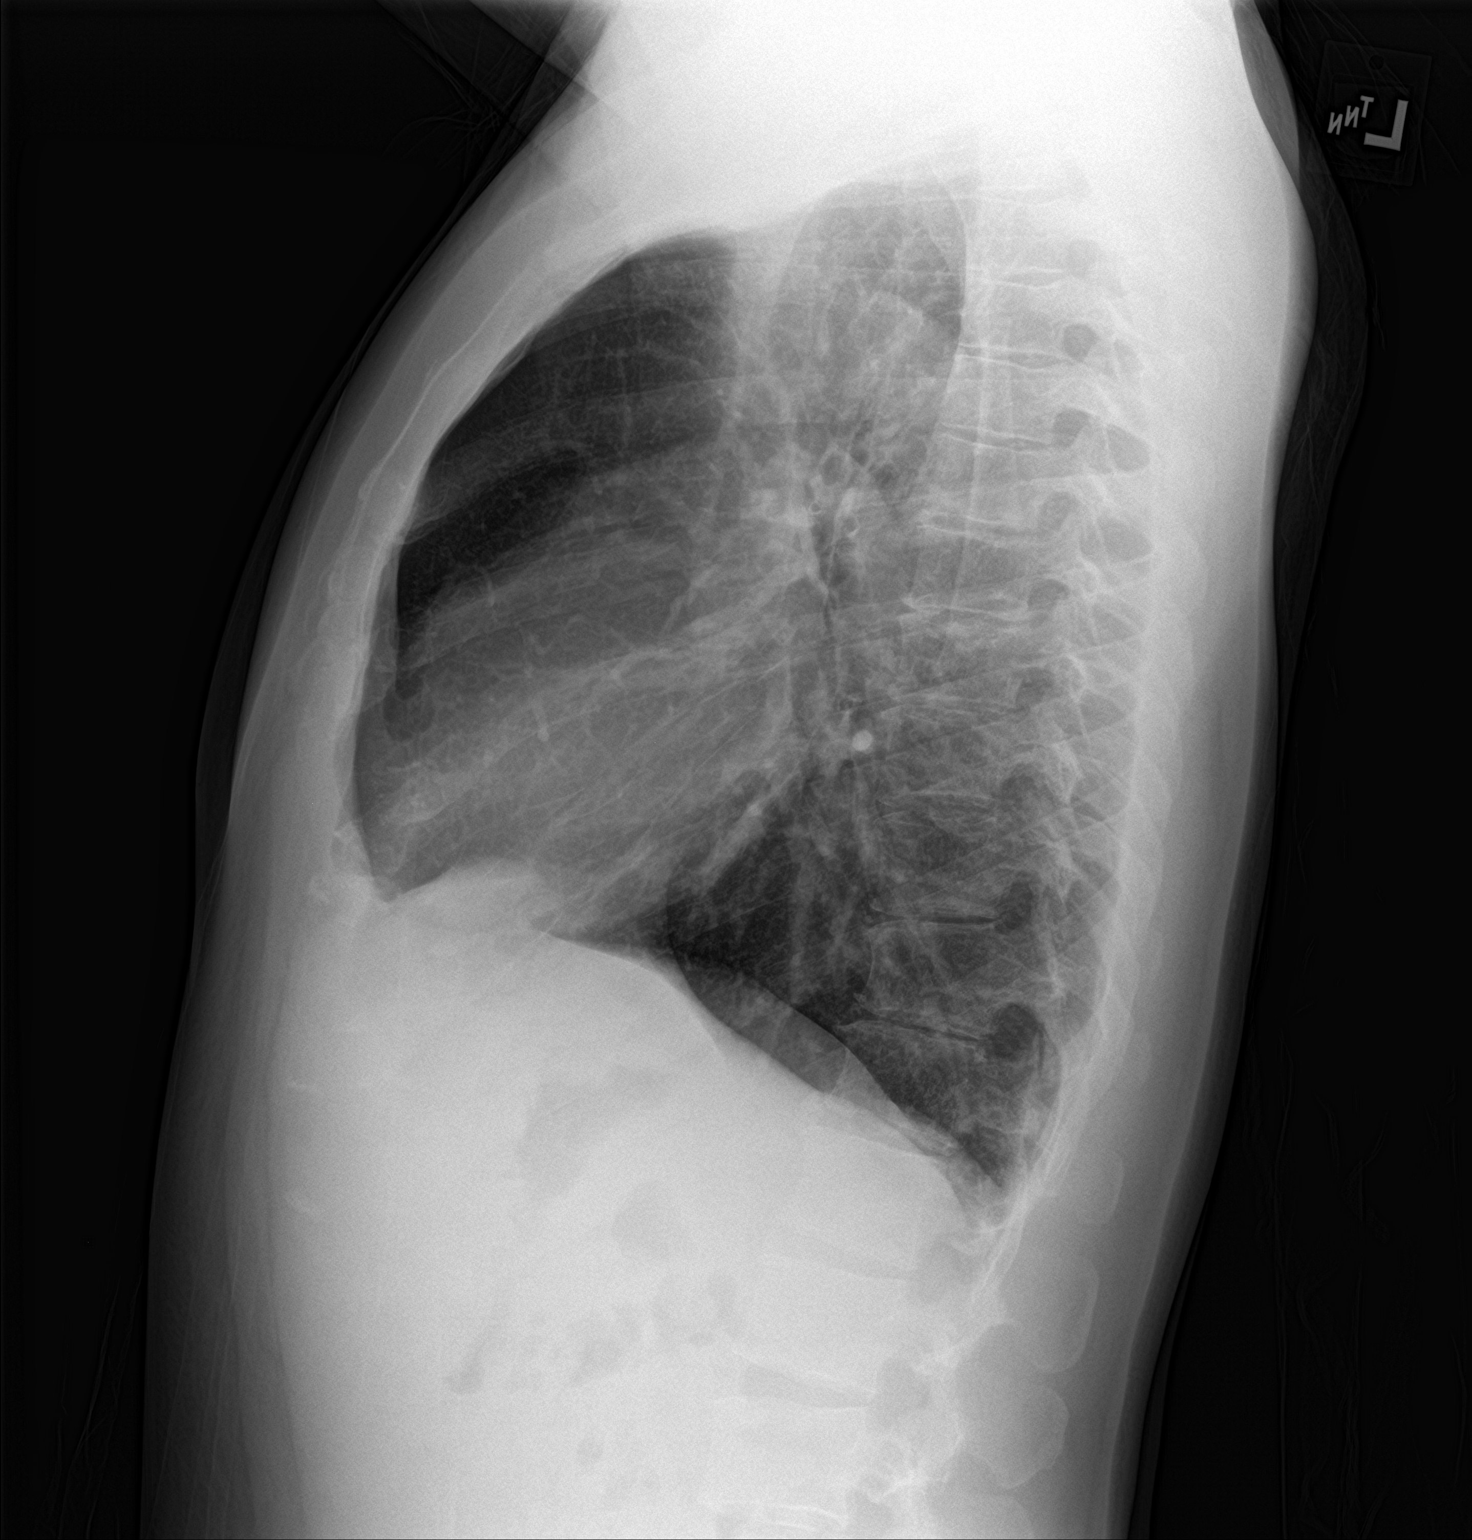

[2 of 2 positions shown; findings below may reference images not displayed]

FINDINGS: Pulmonary hyperinflation. Lungs are clear without infiltrate
effusion or mass. Cardiac and mediastinal contours normal. No acute
skeletal abnormality.
IMPRESSION: Mild pulmonary hyperinflation.  No acute abnormality.

## 2020-06-05 ENCOUNTER — Encounter: Payer: Self-pay | Admitting: *Deleted

## 2020-06-05 ENCOUNTER — Ambulatory Visit (HOSPITAL_COMMUNITY)
Admission: EM | Admit: 2020-06-05 | Discharge: 2020-06-05 | Disposition: A | Discharge status: Rehabilitation Facility | Payer: No Payment, Other | Attending: Psychiatry | Admitting: Psychiatry

## 2020-06-05 ENCOUNTER — Other Ambulatory Visit: Payer: Self-pay

## 2020-06-05 ENCOUNTER — Emergency Department
Admission: EM | Admit: 2020-06-05 | Discharge: 2020-06-05 | Disposition: A | Discharge status: Home / Self Care | Payer: Self-pay | Attending: Emergency Medicine | Admitting: Emergency Medicine

## 2020-06-05 DIAGNOSIS — Z79899 Other long term (current) drug therapy: Secondary | ICD-10-CM | POA: Insufficient documentation

## 2020-06-05 DIAGNOSIS — F1291 Cannabis use, unspecified, in remission: Secondary | ICD-10-CM

## 2020-06-05 DIAGNOSIS — J45909 Unspecified asthma, uncomplicated: Secondary | ICD-10-CM | POA: Insufficient documentation

## 2020-06-05 DIAGNOSIS — Z87898 Personal history of other specified conditions: Secondary | ICD-10-CM

## 2020-06-05 DIAGNOSIS — F411 Generalized anxiety disorder: Secondary | ICD-10-CM | POA: Insufficient documentation

## 2020-06-05 DIAGNOSIS — F19950 Other psychoactive substance use, unspecified with psychoactive substance-induced psychotic disorder with delusions: Secondary | ICD-10-CM | POA: Insufficient documentation

## 2020-06-05 DIAGNOSIS — F333 Major depressive disorder, recurrent, severe with psychotic symptoms: Secondary | ICD-10-CM | POA: Insufficient documentation

## 2020-06-05 DIAGNOSIS — E039 Hypothyroidism, unspecified: Secondary | ICD-10-CM | POA: Insufficient documentation

## 2020-06-05 DIAGNOSIS — F17210 Nicotine dependence, cigarettes, uncomplicated: Secondary | ICD-10-CM | POA: Insufficient documentation

## 2020-06-05 DIAGNOSIS — F4323 Adjustment disorder with mixed anxiety and depressed mood: Secondary | ICD-10-CM | POA: Diagnosis not present

## 2020-06-05 DIAGNOSIS — I10 Essential (primary) hypertension: Secondary | ICD-10-CM | POA: Insufficient documentation

## 2020-06-05 DIAGNOSIS — F1290 Cannabis use, unspecified, uncomplicated: Secondary | ICD-10-CM | POA: Insufficient documentation

## 2020-06-05 NOTE — ED Triage Notes (Signed)
Pt reports he was sent to [facility] rescue mission from [place] today.  Rescue mission would not accept pt because pt smokes marijuana daily.  Pt here for detox of mariajuana.  Pt alert    Speech clear.

## 2020-06-05 NOTE — Discharge Instructions (Addendum)

## 2020-06-05 NOTE — BH Assessment (Signed)
TRIAGE NOTE:  Pt is a 40 year old male who presented to [facility] via [facility] for evaluation.  Pt lives in [place], [place].  He is divorced and receives disability.  He also receives outpatient psychiatric services through [facility].  Pt endorsed suicidal ideation with plan to shoot himself due to housing issues.  Pt reported that he has felt suicidal for about three weeks.  Pt denied homicidal ideation and hallucination.  Pt endorsed use of THC and beer.  He said he uses THC about three times a day.  He said he ingested about 24 oz of beer over the last 24 hours.   Level of care:  Emergent

## 2020-06-05 NOTE — BH Assessment (Signed)
Comprehensive Clinical Assessment (CCA) Note  06/05/2020 Kort Stettler [place] III [id number]  Disposition:  Marvia Pickles, NP recommends pt follow up with outpt psychiatric tx provider. Housing assistance provided to pt at his request.  Visit Diagnosis: MDD, recurrent, severe without sx of psychosis; GAD   Chief Complaint:  Chief Complaint  Patient presents with  . Urgent emergent evaluation  . Depression  . Suicidal   The patient demonstrates the following risk factors for suicide: Chronic risk factors for suicide include: psychiatric disorder of Depression and anxiety, previous suicide attempts once in 2006 by xanax overdose and completed suicide in a family member. Acute risk factors for suicide include: family or marital conflict, social withdrawal/isolation and loss (financial, interpersonal, professional). Protective factors for this patient include: positive therapeutic relationship. Considering these factors, the overall suicide risk at this point appears to be high. Patient is not appropriate for outpatient follow up.  [facility] from 06/05/2020 in [place] High Risk      CCA Screening, Triage and Referral (STR)  Patient Reported Information How did you hear about [place]? Self  Whom do you see for routine medical problems? Hospital ER   What Is the Reason for Your Visit/Call Today? No data recorded How Long Has This Been Causing You Problems? > than 6 months  What Do You Feel Would Help You the Most Today? Other (Comment) (inpt)   Have You Recently Been in Any Inpatient Treatment (Hospital/Detox/Crisis Center/28-Day Program)? No   Have You Ever Received Services From [facility] Before? Yes  Who Do You See at [facility]? [facility] inpt x 2 in past   Have You Recently Had Any Thoughts About Hurting Yourself? Yes  Are You Planning to Commit Suicide/Harm Yourself At This time? Yes (been planning x 3  weeks)   Have you Recently Had Thoughts About Hurting Someone Gu[place]upe Dawn? No  Explanation: No data recorded  Have You Used Any Alcohol or Drugs in the Past 24 Hours? Yes  How Long Ago Did You Use Drugs or Alcohol? No data recorded What Did You Use and How Much? thc 2-3x daily   Do You Currently Have a Therapist/Psychiatrist? Yes  Name of Therapist/Psychiatrist: [facility]   Have You Been Recently Discharged From Any Office Practice or Programs? No  Explanation of Discharge From Practice/Program: No data recorded    CCA Screening Triage Referral Assessment Type of Contact: Face-to-Face   Collateral Involvement: verbal auth to speak with his emergency contact- [phone]   Is CPS involved or ever been involved? Never  Is APS involved or ever been involved? Never   Patient Determined To Be At Risk for Harm To Self or Others Based on Review of Patient Reported Information or Presenting Complaint? Yes, for Self-Harm   Location of Assessment: [facility] Services   Does Patient Present under Involuntary Commitment? No  IVC Papers Initial File Date: No data recorded  [place] of Residence: Other (Comment) [place])   Patient Currently Receiving the Following Services: Medication Management   Determination of Need: Emergent (2 hours)   Options For Referral: Inpatient Hospitalization; Medication Management; [facility]     CCA Biopsychosocial Intake/Chief Complaint:  Depression, SI  Current Symptoms/Problems: depression, SI with plan, anxiety   Patient Reported Schizophrenia/Schizoaffective Diagnosis in Past: No   Type of Services Patient Feels are Needed: inpt [facility] admission   Mental Health Symptoms Depression:  Change in energy/activity; Difficulty Concentrating; Fatigue; Hopelessness; Increase/decrease in  appetite; Irritability; Sleep (too much or little); Tearfulness; Worthlessness   Duration of Depressive symptoms: Greater than two  weeks   Mania:  N/A   Anxiety:   Difficulty concentrating; Fatigue; Irritability; Restlessness; Sleep; Tension; Worrying   Psychosis:  None   Duration of Psychotic symptoms: No data recorded  Trauma:  N/A   Obsessions:  N/A   Compulsions:  N/A   Inattention:  N/A   Hyperactivity/Impulsivity:  N/A   Oppositional/Defiant Behaviors:  N/A   Emotional Irregularity:  Recurrent suicidal behaviors/gestures/threats   Other Mood/Personality Symptoms:  No data recorded   Mental Status Exam Appearance and self-care  Stature:  Average   Weight:  Thin   Clothing:  Casual   Grooming:  Normal   Cosmetic use:  None   Posture/gait:  Tense   Motor activity:  Not Remarkable   Sensorium  Attention:  Normal   Concentration:  Normal   Orientation:  X5   Recall/memory:  Normal   Affect and Mood  Affect:  Anxious; Depressed; Tearful   Mood:  Anxious; Depressed   Relating  Eye contact:  Normal   Facial expression:  Anxious; Sad   Attitude toward examiner:  Cooperative   Thought and Language  Speech flow: Clear and Coherent; Soft   Thought content:  Appropriate to Mood and Circumstances   Preoccupation:  None   Hallucinations:  None   Organization:  No data recorded  [employer] of Knowledge:  Fair   Intelligence:  Average   Abstraction:  Normal   Judgement:  Impaired   Reality Testing:  Adequate   Insight:  Fair; Gaps   Decision Making:  Paralyzed   Social Functioning  Social Maturity:  Isolates   Social Judgement:  Victimized   Stress  Stressors:  Family conflict; Grief/losses   Coping Ability:  Deficient supports   Skill Deficits:  Interpersonal; Decision making   Supports:  Support needed     Exercise/Diet: Exercise/Diet Have You Gained or Lost A Significant Amount of Weight in the Past Six Months?: No ("bc I am forced to eat food cooked by woman he's living with (step GF's friend's wife)) Do You Have Any Trouble  Sleeping?: Yes Explanation of Sleeping Difficulties: 6 hours at most   CCA Employment/Education Employment/Work Situation: Employment / Work Situation Employment situation: On disability Where is patient currently employed?: [employer] How long has patient been employed?: since 6/21 Why is patient on disability: anxiety and depression Patient's job has been impacted by current illness: Yes What is the longest time patient has a held a job?: 12 years Has patient ever been in the [employer]?: No  Education: Education Is Patient Currently Attending School?: No   CCA Family/Childhood History Family and Relationship History: Family history Marital status: Divorced Divorced, when?: years ago What types of issues is patient dealing with in the relationship?: after divorce pt was living with a girlfriend when she had a heart attack and died about a year ago What is your sexual orientation?: heterosexual Has your sexual activity been affected by drugs, alcohol, medication, or emotional stress?: n/a  Does patient have children?: No  Childhood History:  Childhood History By whom was/is the patient raised?: Both parents Additional childhood history information: "good childhood." "I don't think they had any substance abuse problems." Patient states that his mother suffers from depression and anxiety. "She and I are very much alike."  Description of patient's relationship with caregiver when they were a child: close to both parents Does  patient have siblings?: Yes Number of Siblings: 1 Description of patient's current relationship with siblings: "I have a little sister. I think she takes medication for depression but I'm not sure."  Did patient suffer any verbal/emotional/physical/sexual abuse as a child?: No Has patient ever been sexually abused/assaulted/raped as an adolescent or adult?: No Witnessed domestic violence?: No Has patient been affected by domestic violence as an adult?:  No    CCA Substance Use Alcohol/Drug Use: Alcohol / Drug Use Pain Medications: See MAR Prescriptions: See MAR Over the Counter: See MAR History of alcohol / drug use?: Yes Substance #1 Name of Substance 1: thc 1 - Frequency: 2-3 x daily      DSM5 Diagnoses: Patient Active Problem List   Diagnosis Date Noted  . 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 GAD (generalized anxiety disorder) 07/05/2016  . Restless leg syndrome 07/05/2016  . MDD (major depressive disorder), recurrent, severe, with psychosis ([place]) 07/05/2016    Patient Centered Plan: Patient is on the following Treatment Plan(s):  Anxiety and Depression    Zaevion Parke Tora Perches, LCSW

## 2020-06-05 NOTE — ED Notes (Signed)
Dinner tray served

## 2020-06-05 NOTE — ED Provider Notes (Signed)
Behavioral Health Urgent Care Medical Screening Exam  Patient Name: Zachary Dean MRN: [id number] Date of Evaluation: 06/05/20 Chief Complaint: Chief Complaint/Presenting Problem: Depression, SI Diagnosis:  Final diagnoses:  Adjustment disorder with mixed anxiety and depressed mood    History of Present illness: Zachary Dean is a 40 y.o. male.  Patient presents voluntarily as a walk-in to the [facility] reporting worsening depression with suicidal ideations with a plan to shoot himself.  However patient reports that he does not have a gun but states he can go pick one up from the pawnshop.  Patient reports that he sees Dr. Chucky May, and is prescribed Adderall and Klonopin for his anxiety and depression.  Patient reports that he is currently employed part-time at [employer] and is also receiving disability for his mental health disorders.  He reports that his depression has been getting worse since his girlfriend died 1 year ago.  Patient presents today tearful and crying during the evaluation at the beginning.  Patient reports that he was living with his mother and his stepfather however he had to move out of their move in with his girlfriend.  He states that after his girlfriend passed away he had to move back in with his mother and stepfather.  He reported that he had issues with his stepfather and was told that he had to move out.  He states he is currently living with his stepfather's friend and they are very controlling over him and overwhelming.  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  After an attempt to discuss medications that the patient has been known to reports being on Rexulti, Spravato, Trintellix, Abilify, Seroquel, Effexor, and having ECT in the past he reports that the biggest thing he needs is a new place to live.  At this time the patient started crying and did not appear as depressed  when he found that there is a possibility of some additional places to stay.  Patient states he is not interested in changing his medications.  He states that he needs a new place to live because he cannot continue living where he lives at now and that is what is making him feel depressed and suicidal.  Patient is requesting for housing options.  Social work and peers support assisted with housing options for the patient.  The patient excepted at [facility] rescue mission.  Patient did phone interview with the facility and was accepted.  Patient stated that he was ready to go today and was provided with transportation via safe transport.  Patient was then denying any suicidal or homicidal ideations and denied any hallucinations due to having a new place to live.  Psychiatric Specialty Exam  Presentation  General Appearance:Appropriate for Environment; Casual  Eye Contact:Good  Speech:Clear and Coherent; Normal Rate  Speech Volume:Normal  Handedness:Right   Mood and Affect  Mood:Depressed  Affect:Appropriate; Congruent; Depressed   Thought Process  Thought Processes:Coherent  Descriptions of Associations:Intact  Orientation:Full (Time, Place and Person)  Thought Content:WDL  Hallucinations:None  Ideas of Reference:None  Suicidal Thoughts:-- (At first reports SI with plan, but then wants different housing)  Homicidal Thoughts:No   Sensorium  Memory:Immediate Good; Recent Good; Remote Good  Judgment:Good  Insight:Good   Executive Functions  Concentration:Good  Attention Span:Good  [place] of Knowledge:Good  Language:Good   Psychomotor Activity  Psychomotor Activity:Normal   Assets  Assets:Communication Skills; Desire for Improvement; Financial  Resources/Insurance; Housing; Physical Health; Social Support; Transportation   Sleep  Sleep:Good  Number of hours: No data recorded  No data recorded  Physical Exam: Physical Exam Vitals and  nursing note reviewed.  Constitutional:      Appearance: He is well-developed.  HENT:     Head: Normocephalic.  Eyes:     Pupils: Pupils are equal, round, and reactive to light.  Cardiovascular:     Rate and Rhythm: Normal rate.  Pulmonary:     Effort: Pulmonary effort is normal.  Musculoskeletal:        General: Normal range of motion.  Neurological:     Mental Status: He is alert and oriented to person, place, and time.    Review of Systems  Constitutional: Negative.   HENT: Negative.   Eyes: Negative.   Respiratory: Negative.   Cardiovascular: Negative.   Gastrointestinal: Negative.   Genitourinary: Negative.   Musculoskeletal: Negative.   Skin: Negative.   Neurological: Negative.   Endo/Heme/Allergies: Negative.   Psychiatric/Behavioral: Positive for depression.   Blood pressure (!) 148/90, pulse 86, temperature 97.8 F (36.6 C), temperature source Temporal, resp. rate 16, SpO2 98 %. There is no height or weight on file to calculate BMI.  Musculoskeletal: Strength & Muscle Tone: within normal limits Gait & Station: normal Patient leans: N/A   [facility] MSE Discharge Disposition for Follow up and Recommendations: Based on my evaluation the patient does not appear to have an emergency medical condition and can be discharged with resources and follow up care in outpatient services for Medication Management and [place], FNP 06/05/2020, 1:47 PM

## 2020-06-05 NOTE — ED Notes (Signed)
Vol pending soc work

## 2020-06-05 NOTE — ED Provider Notes (Signed)
[facility] Emergency Department Provider Note   ____________________________________________   Event Date/Time   First MD Initiated Contact with Patient 06/05/20 1757     (approximate)  I have reviewed the triage vital signs and the nursing notes.   HISTORY  Chief Complaint Drug Problem    HPI Zachary Dean is a 40 y.o. male with stated past medical history of anxiety/depression, bipolar disorder, thyroid disease, and marijuana use who presents from [place] [facility] who refused his admission due to marijuana use.  Patient was seen in an outpatient psychiatry clinic today and deemed appropriate to transfer to [facility] rescue mission after a phone interview during which he was accepted as a new patient.  However, when the patient got there he was told that they would not accept him because he smokes marijuana daily and that he needed to go to the hospital for "detox of marijuana".  Patient does not understand why he was sent here and understands that there is no detoxification for marijuana.  Patient has no other complaints at this time.  Patient currently denies any suicidal ideation, homicidal ideation, auditory/visual hallucinations.  Patient denies any other illicit drug use or alcohol abuse.         Past Medical History:  Diagnosis Date  . Anxiety   . Asthma    childhood asthma  . Bipolar disorder ([place])   . Depression   . GERD (gastroesophageal reflux disease)   . Hyperlipidemia   . Hypertension   . Hypothyroid   . Thyroid disease     Patient Active Problem List   Diagnosis Date Noted  . Substance-induced psychotic disorder with delusions ([place]) 07/05/2016  . GAD (generalized anxiety disorder) 07/05/2016  . Restless leg syndrome 07/05/2016  . MDD (major depressive disorder), recurrent, severe, with psychosis ([place]) 07/05/2016    Past Surgical History:  Procedure Laterality Date  . TONSILLECTOMY     Age 37 years old     Prior to Admission medications   Medication Sig Start Date End Date Taking? Authorizing Provider  amphetamine-dextroamphetamine (ADDERALL) 30 MG tablet Take 30 mg by mouth 3 (three) times daily.    [provider]  clonazePAM (KLONOPIN) 1 MG tablet Take 2 mg by mouth daily.    [provider]  gabapentin (NEURONTIN) 300 MG capsule Take 1 capsule (300 mg total) by mouth 3 (three) times daily. Patient taking differently: Take 600 mg by mouth 3 (three) times daily.  07/11/16   Starkes-Perry, Gayland Curry, FNP  levothyroxine (SYNTHROID, LEVOTHROID) 25 MCG tablet Take 1 tablet (25 mcg total) by mouth daily before breakfast. 07/12/16   Starkes-Perry, Gayland Curry, FNP  lithium carbonate (ESKALITH) 450 MG CR tablet Take 450 mg by mouth daily. 04/28/17   [provider]  Multiple Vitamin (MULTIVITAMIN WITH MINERALS) TABS tablet Take 1 tablet by mouth daily.    [provider]  prazosin (MINIPRESS) 1 MG capsule TAKE 6 CAPSULES  6 MG TOTAL  BY MOUTH AT BEDTIME 08/21/16   [provider]  REXULTI 3 MG TABS Take 1 tablet by mouth daily. 09/07/16   [provider]  TRINTELLIX 20 MG TABS Take 20 mg by mouth daily. 09/07/16   [provider]  VYVANSE 70 MG capsule Take 70 mg by mouth daily.  08/25/16   [provider]    Allergies Antihistamines, diphenhydramine-type  Family History  Problem Relation Age of Onset  . Cancer Father   . Diabetes Father   . Anxiety  disorder Mother   . Depression Mother   . Anxiety disorder Sister   . Depression Sister     Social History Social History   Tobacco Use  . Smoking status: Current Every Day Smoker    Packs/day: 1.00    Years: 5.00    Pack years: 5.00    Types: Cigarettes  . Smokeless tobacco: Never Used  Vaping Use  . Vaping Use: Never used  Substance Use Topics  . Alcohol use: No  . Drug use: No    Review of Systems Constitutional: No fever/chills Eyes: No visual changes. ENT: No  sore throat. Cardiovascular: Denies chest pain. Respiratory: Denies shortness of breath. Gastrointestinal: No abdominal pain.  No nausea, no vomiting.  No diarrhea. Genitourinary: Negative for dysuria. Musculoskeletal: Negative for acute arthralgias Skin: Negative for rash. Neurological: Negative for headaches, weakness/numbness/paresthesias in any extremity Psychiatric: Negative for suicidal ideation/homicidal ideation   ____________________________________________   PHYSICAL EXAM:  VITAL SIGNS: ED Triage Vitals  Enc Vitals Group     BP 06/05/20 1752 (!) 146/93     Pulse Rate 06/05/20 1752 90     Resp 06/05/20 1752 18     Temp 06/05/20 1752 97.9 F (36.6 C)     Temp Source 06/05/20 1752 Oral     SpO2 06/05/20 1752 98 %     Weight 06/05/20 1749 145 lb (65.8 kg)     Height 06/05/20 1749 5\' 8"  (1.727 m)     Head Circumference --      Peak Flow --      Pain Score 06/05/20 1749 0     Pain Loc --      Pain Edu? --      Excl. in [place]? --    Constitutional: Alert and oriented. Well appearing and in no acute distress. Eyes: Conjunctivae are normal. PERRL. Head: Atraumatic. Nose: No congestion/rhinnorhea. Mouth/Throat: Mucous membranes are moist. Neck: No stridor Cardiovascular: Grossly normal heart sounds.  Good peripheral circulation. Respiratory: Normal respiratory effort.  No retractions. Gastrointestinal: Soft and nontender. No distention. Musculoskeletal: No obvious deformities Neurologic:  Normal speech and language. No gross focal neurologic deficits are appreciated. Skin:  Skin is warm and dry. No rash noted. Psychiatric: Mood and affect are normal. Speech and behavior are normal.  ____________________________________________   LABS (all labs ordered are listed, but only abnormal results are displayed)  Labs Reviewed - No data to display  PROCEDURES  Procedure(s) performed (including Critical  Care):  Procedures   ____________________________________________   INITIAL IMPRESSION / ASSESSMENT AND PLAN / ED COURSE  As part of my medical decision making, I reviewed the following data within the [place] notes reviewed and incorporated,  Old chart reviewed, and Notes from prior ED visits reviewed and incorporated        Patient presents from [place] [facility] after being told originally that he was accepted as a new patient and then told that he needed to present to the hospital for marijuana detox.  Patient understands that there is no such thing is marijuana detoxification and and is confused as to why [place] [facility] did not know that.  Attempts were made to contact [place] [facility] for any collateral information and these attempts went straight to voicemail.  I spoke to our social services in order to get in touch with anyone from this mission but attempts were unsuccessful.  Patient was offered shelter overnight and likely discharge to the mission in the morning however he states  that he has a safe place to go as well as a ride to get there and wishes to be discharged.  Patient shows no red flag symptomatology including no suicidal ideation, homicidal ideation, or responding to internal stimuli that would be a cause for IVC or admission.  Patient is safe for discharge at this time with appropriate psychiatric follow-up which he was already given by psychiatry today      ____________________________________________   FINAL CLINICAL IMPRESSION(S) / ED DIAGNOSES  Final diagnoses:  History of marijuana use     ED Discharge Orders    None       Note:  This document was prepared using Dragon voice recognition software and may include unintentional dictation errors.   Naaman Plummer, MD 06/05/20 1950

## 2020-06-11 ENCOUNTER — Ambulatory Visit (INDEPENDENT_AMBULATORY_CARE_PROVIDER_SITE_OTHER): Payer: Self-pay | Admitting: Psychiatry

## 2020-06-11 ENCOUNTER — Other Ambulatory Visit: Payer: Self-pay

## 2020-06-11 ENCOUNTER — Encounter: Payer: Self-pay | Admitting: Psychiatry

## 2020-06-11 DIAGNOSIS — F331 Major depressive disorder, recurrent, moderate: Secondary | ICD-10-CM
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
## 2020-07-29 ENCOUNTER — Ambulatory Visit: Payer: Self-pay | Admitting: Psychiatry

## 2020-09-13 ENCOUNTER — Emergency Department (HOSPITAL_COMMUNITY)
Admission: EM | Admit: 2020-09-13 | Discharge: 2020-09-14 | Disposition: A | Discharge status: Home / Self Care | Payer: Self-pay | Attending: Emergency Medicine | Admitting: Emergency Medicine

## 2020-09-13 ENCOUNTER — Other Ambulatory Visit: Payer: Self-pay

## 2020-09-13 ENCOUNTER — Encounter (HOSPITAL_COMMUNITY): Payer: Self-pay | Admitting: Emergency Medicine

## 2020-09-13 DIAGNOSIS — F1010 Alcohol abuse, uncomplicated: Secondary | ICD-10-CM | POA: Insufficient documentation

## 2020-09-13 DIAGNOSIS — F411 Generalized anxiety disorder: Secondary | ICD-10-CM | POA: Insufficient documentation

## 2020-09-13 DIAGNOSIS — Z20822 Contact with and (suspected) exposure to covid-19: Secondary | ICD-10-CM | POA: Insufficient documentation

## 2020-09-13 DIAGNOSIS — J45909 Unspecified asthma, uncomplicated: Secondary | ICD-10-CM | POA: Insufficient documentation

## 2020-09-13 DIAGNOSIS — I10 Essential (primary) hypertension: Secondary | ICD-10-CM | POA: Insufficient documentation

## 2020-09-13 DIAGNOSIS — F339 Major depressive disorder, recurrent, unspecified: Secondary | ICD-10-CM | POA: Insufficient documentation

## 2020-09-13 DIAGNOSIS — Y900 Blood alcohol level of less than 20 mg/100 ml: Secondary | ICD-10-CM | POA: Insufficient documentation

## 2020-09-13 DIAGNOSIS — F1014 Alcohol abuse with alcohol-induced mood disorder: Secondary | ICD-10-CM

## 2020-09-13 DIAGNOSIS — F32A Depression, unspecified: Secondary | ICD-10-CM

## 2020-09-13 DIAGNOSIS — E039 Hypothyroidism, unspecified: Secondary | ICD-10-CM | POA: Insufficient documentation

## 2020-09-13 DIAGNOSIS — F1220 Cannabis dependence, uncomplicated: Secondary | ICD-10-CM | POA: Insufficient documentation

## 2020-09-13 DIAGNOSIS — Z79899 Other long term (current) drug therapy: Secondary | ICD-10-CM | POA: Insufficient documentation

## 2020-09-13 DIAGNOSIS — F17210 Nicotine dependence, cigarettes, uncomplicated: Secondary | ICD-10-CM | POA: Insufficient documentation

## 2020-09-13 LAB — COMPREHENSIVE METABOLIC PANEL
ALT: 261 U/L — ABNORMAL HIGH (ref 0–44)
AST: 183 U/L — ABNORMAL HIGH (ref 15–41)
Albumin: 4.4 g/dL (ref 3.5–5.0)
Alkaline Phosphatase: 88 U/L (ref 38–126)
Anion gap: 14 (ref 5–15)
BUN: 8 mg/dL (ref 6–20)
CO2: 24 mmol/L (ref 22–32)
Calcium: 9 mg/dL (ref 8.9–10.3)
Chloride: 99 mmol/L (ref 98–111)
Creatinine, Ser: 0.85 mg/dL (ref 0.61–1.24)
GFR, Estimated: >60 mL/min (ref >60)
Glucose, Bld: 98 mg/dL (ref 70–99)
Potassium: 4 mmol/L (ref 3.5–5.1)
Sodium: 137 mmol/L (ref 135–145)
Total Bilirubin: 2 mg/dL — ABNORMAL HIGH (ref 0.3–1.2)
Total Protein: 7 g/dL (ref 6.5–8.1)

## 2020-09-13 LAB — CBC
HCT: 42.5 % (ref 39.0–52.0)
Hemoglobin: 14.4 g/dL (ref 13.0–17.0)
MCH: 33.1 pg (ref 26.0–34.0)
MCHC: 33.9 g/dL (ref 30.0–36.0)
MCV: 97.7 fL (ref 80.0–100.0)
Platelets: 228 10*3/uL (ref 150–400)
RBC: 4.35 MIL/uL (ref 4.22–5.81)
RDW: 12.7 % (ref 11.5–15.5)
WBC: 11 10*3/uL — ABNORMAL HIGH (ref 4.0–10.5)
nRBC: 0 % (ref 0.0–0.2)

## 2020-09-13 LAB — RAPID URINE DRUG SCREEN, HOSP PERFORMED
Amphetamines: NOT DETECTED
Barbiturates: NOT DETECTED
Benzodiazepines: NOT DETECTED
Cocaine: NOT DETECTED
Opiates: NOT DETECTED
Tetrahydrocannabinol: POSITIVE — AB

## 2020-09-13 LAB — RESP PANEL BY RT-PCR (FLU A&B, COVID) ARPGX2
Influenza A by PCR: NEGATIVE
Influenza B by PCR: NEGATIVE
SARS Coronavirus 2 by RT PCR: NEGATIVE

## 2020-09-13 LAB — ETHANOL: Alcohol, Ethyl (B): <10 mg/dL (ref <10)

## 2020-09-13 MED ORDER — SODIUM CHLORIDE 0.9 % IV BOLUS
1000.0000 mL | Freq: Once | INTRAVENOUS | Status: AC
  Administered 2020-09-13: 20:00:00 1000 mL via INTRAVENOUS

## 2020-09-13 MED ORDER — NICOTINE 14 MG/24HR TD PT24
14.0000 mg | MEDICATED_PATCH | Freq: Every day | TRANSDERMAL | Status: DC
  Administered 2020-09-13: 23:00:00 14 mg via TRANSDERMAL

## 2020-09-13 MED ORDER — NICOTINE 14 MG/24HR TD PT24
14.0000 mg | MEDICATED_PATCH | Freq: Every day | TRANSDERMAL | Status: DC
  Filled 2020-09-13: qty 1

## 2020-09-13 MED ORDER — LORAZEPAM 2 MG/ML IJ SOLN
2.0000 mg | Freq: Once | INTRAMUSCULAR | Status: AC
  Administered 2020-09-13: 20:00:00 2 mg via INTRAVENOUS
  Filled 2020-09-13: qty 1

## 2020-09-13 MED ORDER — CLONAZEPAM 0.5 MG PO TABS
1.0000 mg | ORAL_TABLET | Freq: Once | ORAL | Status: AC
  Administered 2020-09-13: 23:00:00 1 mg via ORAL
  Filled 2020-09-13: qty 2

## 2020-09-13 NOTE — ED Triage Notes (Addendum)
Pt reports he had an appointment with his psychologist and notified her that he relapsed with alcohol a couple months ago. Pt was taking Klonopin, his doctor took him off - last dose now about 1 week ago and he is having withdrawals. Pt also reports he is an alcoholic and is feeling like he's starting to have withdrawals. Last drink 3-4 hours ago. Pt reports he can't sleep. Denies pain. Pt reports hx of withdrawals, without seizures. Hx of htn, not currently on medications. Denies SI/HI, hallucinations at this time.

## 2020-09-13 NOTE — ED Provider Notes (Signed)
Emergency Medicine Provider Triage Evaluation Note  Zachary Dean , a 40 y.o. male  was evaluated in triage.  Pt complains of withdrawal.  He states that he was taken off of his benzodiazepines by his psychiatrist after he had been found to relapse on alcohol.  He reports over the past few months he has been drinking about a 12 pack a day.  She he states that he has had 3 drinks today with his last 1 being at 3 PM. Currently his most bothersome symptom is the anxiety.  He reports he also uses marijuana and denies any other drug use. No SI, HI or AVH voiced..  Review of Systems  Positive: Withdrawal, anxiety Negative: Fever, seizure  Physical Exam  BP (!) 168/102 (BP Location: Right Arm)   Pulse 92   Temp 99.5 F (37.5 C) (Oral)   Resp 16   Ht 5\' 8"  (1.727 m)   Wt 68 kg   SpO2 99%   BMI 22.81 kg/m  Gen:   Awake, no distress   Resp:  Normal effort  MSK:   Moves extremities without difficulty  Other:  Appears anxious, poor eyecontact.   Medical Decision Making  Medically screening exam initiated at 5:44 PM.  Appropriate orders placed.  Thamas Louis Alioto Dean was informed that the remainder of the evaluation will be completed by another provider, this initial triage assessment does not replace that evaluation, and the importance of remaining in the ED until their evaluation is complete.  Note: Portions of this report may have been transcribed using voice recognition software. Every effort was made to ensure accuracy; however, inadvertent computerized transcription errors may be present     Ollen Gross 09/13/20 1746    Truddie Hidden, MD 09/13/20 [phone]

## 2020-09-13 NOTE — ED Notes (Signed)
Wanted to make sure he is not having withdrawals from ETOH or if it is anxiety or not. Symptoms feels a lot of anxiety

## 2020-09-13 NOTE — ED Provider Notes (Addendum)
[facility] EMERGENCY DEPARTMENT Provider Note   CSN: [id number] Arrival date & time: 09/13/20  1601     History Chief Complaint  Patient presents with   Withdrawal    Zachary Dean is a 40 y.o. male.  HPI  40 year old male with past medical history of anxiety, depression, HTN, HLD, alcohol abuse presents emergency department with concern for anxiety.  Patient had reportedly been in remission for alcohol abuse.  However about 2 months ago he started drinking daily again, about a 12 pack a day.  After finding this out to his doctor took him off of his Klonopin which is how he was had been managing his anxiety.  He has been off the Klonopin for about a week and fear is significantly anxious.  States he is unable to sleep. He has had alcohol WD before and doesn't feel this is the same. Denies tremors, hallucinations. He has had thoughts that he can't "live like this" but denies SI and suicidal plan.  No homicidal ideation.  Patient is withdrawn from alcohol before without seizures.  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   GAD (generalized anxiety disorder) 07/05/2016   Restless leg syndrome 07/05/2016   MDD (major depressive disorder), recurrent, severe, with psychosis ([place]) 07/05/2016    Past Surgical History:  Procedure Laterality Date   TONSILLECTOMY     Age 71 years old       Family History  Problem Relation Age of Onset   Cancer Father    Diabetes Father    Anxiety disorder Mother    Depression Mother    Anxiety disorder Sister    Depression Sister     Social History   Tobacco Use   Smoking status: Every Day     Packs/day: 1.00    Years: 5.00    Pack years: 5.00    Types: Cigarettes   Smokeless tobacco: Never  Vaping Use   Vaping Use: Never used  Substance Use Topics   Alcohol use: Yes    Alcohol/week: 12.0 standard drinks    Types: 12 Cans of beer per week    Comment: relapsed a couple months ago   Drug use: No    Home Medications Prior to Admission medications   Medication Sig Start Date End Date Taking? Authorizing Provider  amphetamine-dextroamphetamine (ADDERALL) 30 MG tablet Take 30 mg by mouth 3 (three) times daily.    [provider]  clonazePAM (KLONOPIN) 1 MG tablet Take 2 mg by mouth daily.    [provider]  gabapentin (NEURONTIN) 300 MG capsule Take 1 capsule (300 mg total) by mouth 3 (three) times daily. Patient taking differently: Take 600 mg by mouth 3 (three) times daily.  07/11/16   Starkes-Perry, Gayland Curry, FNP  levothyroxine (SYNTHROID, LEVOTHROID) 25 MCG tablet Take 1 tablet (25 mcg total) by mouth daily before breakfast. 07/12/16   Starkes-Perry, Gayland Curry, FNP  lithium carbonate (ESKALITH) 450 MG CR tablet Take 450 mg by mouth daily. 04/28/17   [provider]  Multiple Vitamin (MULTIVITAMIN WITH MINERALS) TABS tablet Take 1 tablet by mouth daily.  [provider]  prazosin (MINIPRESS) 1 MG capsule TAKE 6 CAPSULES  6 MG TOTAL  BY MOUTH AT BEDTIME 08/21/16   [provider]  REXULTI 3 MG TABS Take 1 tablet by mouth daily. 09/07/16   [provider]  TRINTELLIX 20 MG TABS Take 20 mg by mouth daily. 09/07/16   [provider]  VYVANSE 70 MG capsule Take 70 mg by mouth daily.  08/25/16   [provider]    Allergies    Antihistamines, diphenhydramine-type  Review of Systems   Review of Systems  Constitutional:  Positive for fatigue. Negative for chills and fever.  HENT:  Negative for congestion.   Eyes:  Negative for visual disturbance.  Respiratory:  Negative for shortness of breath.   Cardiovascular:   Negative for chest pain.  Gastrointestinal:  Negative for abdominal pain, diarrhea and vomiting.  Skin:  Negative for rash.  Neurological:  Negative for tremors and headaches.  Psychiatric/Behavioral:  Positive for agitation, decreased concentration, sleep disturbance and suicidal ideas. The patient is nervous/anxious and is hyperactive.    Physical Exam Updated Vital Signs BP (!) 162/99   Pulse 87   Temp 98.7 F (37.1 C) (Oral)   Resp 19   Ht 5\' 8"  (1.727 m)   Wt 68 kg   SpO2 100%   BMI 22.81 kg/m   Physical Exam Vitals and nursing note reviewed.  Constitutional:      Appearance: Normal appearance.  HENT:     Head: Normocephalic.     Mouth/Throat:     Mouth: Mucous membranes are moist.  Cardiovascular:     Rate and Rhythm: Normal rate.  Pulmonary:     Effort: Pulmonary effort is normal. No respiratory distress.  Abdominal:     Palpations: Abdomen is soft.     Tenderness: There is no abdominal tenderness.  Skin:    General: Skin is warm.  Neurological:     Mental Status: He is alert and oriented to person, place, and time. Mental status is at baseline.  Psychiatric:     Comments: Anxious, tremulous, tearful    ED Results / Procedures / Treatments   Labs (all labs ordered are listed, but only abnormal results are displayed) Labs Reviewed  RAPID URINE DRUG SCREEN, HOSP PERFORMED - Abnormal; Notable for the following components:      Result Value   Tetrahydrocannabinol POSITIVE (*)    All other components within normal limits  RESP PANEL BY RT-PCR (FLU A&B, COVID) [id number]  ETHANOL  CBC    EKG EKG Interpretation  Date/Time:  Friday September 13 2020 20:08:43 EDT Ventricular Rate:  84 PR Interval:  159 QRS Duration: 101 QT Interval:  387 QTC Calculation: 458 R Axis:   196 Text Interpretation: Sinus rhythm Inferior infarct, acute (LCx) Minimal ST elevation, anterior leads Lateral leads are also involved >>> Acute MI <<< Similar to previous, disagree for acute MI  Confirmed by Lavenia Atlas [phone]) on 09/13/2020 8:21:11 PM  Radiology No results found.  Procedures Procedures   Medications Ordered in ED Medications  sodium chloride 0.9 % bolus 1,000 mL (1,000 mLs Intravenous New Bag/Given 09/13/20 2011)  LORazepam (ATIVAN) injection 2 mg (2 mg Intravenous Given 09/13/20 2012)    ED Course  I have reviewed the triage vital signs and the nursing notes.  Pertinent labs & imaging results that were available during my care of the patient were reviewed by me and considered in my medical decision making (see chart for details).  MDM Rules/Calculators/A&P                          40 year old male presents emergency department with concern for anxiety.  Patient admits that he has relapsed and has been drinking alcohol daily, last drink was this afternoon.  He has been without his anxiety medicine for the past week, feels like his anxiety is extremely high. Has had alcohol WD before and does not feel like this is the same. No tremors, tachycardia.  He does say "he can't live like this" and "doesn't want to live like this", but does not display any acute SI/suicidal plan. Not under IVC.  Blood work shows transaminitis and slightly elevated bilirubin, he has no abdominal pain or tenderness on exam.  Low suspicion for an acute process, currently no nausea/vomiting/diarrhea.  After fluids, anxiety medicine and nicotine patch patient is significantly better.  He states his anxiety is much better controlled.  He would like to talk to behavioral team.  Patient unsure if he wants detox but states he can not go without his anxiety medicine.  Patient will be switched to PD pending behavioral health evaluation. Stable with normal VS at time of TTS consult.   Final Clinical Impression(s) / ED Diagnoses Final diagnoses:  None    Rx / DC Orders ED Discharge Orders     None        Lorelle Gibbs, DO 09/13/20 2341    Lorelle Gibbs, DO 09/13/20  2350

## 2020-09-14 DIAGNOSIS — F1014 Alcohol abuse with alcohol-induced mood disorder: Secondary | ICD-10-CM

## 2020-09-14 MED ORDER — LORAZEPAM 2 MG/ML IJ SOLN
2.0000 mg | Freq: Once | INTRAMUSCULAR | Status: AC
  Administered 2020-09-14: 2 mg via INTRAVENOUS
  Filled 2020-09-14: qty 1

## 2020-09-14 NOTE — ED Notes (Signed)
Pt d/c home per MD order. Discharge summary reviewed with pt, pt verbalizes understanding. No s/s of acute distress noted at discharge, ambulatory off unit. Reports mother is discharge ride home.

## 2020-09-14 NOTE — ED Notes (Signed)
Pt requesting medication and is asking for more meds

## 2020-09-14 NOTE — ED Notes (Signed)
TTS machine at bedside. 

## 2020-09-14 NOTE — Consult Note (Signed)
Telepsych Consultation   Reason for Consult:  Psychiatric Reassessment Referring Physician:  Dr.Horton Location of Patient:    [facility] Location of Provider: Other: virtual home office  Patient Identification: Zachary Dean MRN:  [id number] Principal Diagnosis: Alcohol abuse with alcohol-induced mood disorder ([place]) Diagnosis:  Principal Problem:   Alcohol abuse with alcohol-induced mood disorder ([place])  Total Time spent with patient: 30 minutes  Subjective:   Zachary Dean is a 40 y.o. male patient admitted for overnight observation for psych evaluation of psychiatry.  When seen today, the patient tells me, "I am better today, I just wanted something for anxiety."  Patient seen via telepsych by this provider; chart reviewed and consulted with Dr. Dwyane Dee on 09/14/20.  On evaluation Zachary Dean reports a chronic history for substance abuse, on and off sobriety. Came to the ED yesterday for worsening anxiety, no suicidal or homicidal ideations; Since admissions he was treated with prn benzodiazepines, and monitored for alcohol induced withdrawal symptoms.  He has remained calm and cooperative with staff, has not demonstrated any aggressive behaviors. No concerns with eating or sleep. Today he still endorses intermittent anxiety related to alcohol use, is offered referral for inpatient detox but he is unsure if he wants to stop drinking.  He states he is willing to accept resources for outpatient detox follow-up. He cis clear and coherent and continues to deny suicidal or homicidal ideations.  He is established psychiatric provider, Dr. Toy Care. States his last visit was 2 weeks ago via video visit.     HPI:  Per EDP Assessment dated 09/13/2020@0842  Chief Complaint  Patient presents with   Withdrawal      Zachary Dean is a 40 y.o. male.   HPI   40 year old male with past medical history of anxiety, depression, HTN, HLD, alcohol abuse  presents emergency department with concern for anxiety.  Patient had reportedly been in remission for alcohol abuse.  However about 2 months ago he started drinking daily again, about a 12 pack a day.  After finding this out to his doctor took him off of his Klonopin which is how he was had been managing his anxiety.  He has been off the Klonopin for about a week and fear is significantly anxious.  States he is unable to sleep. He has had alcohol WD before and doesn't feel this is the same. Denies tremors, hallucinations. He has had thoughts that he can't "live like this" but denies SI and suicidal plan.  No homicidal ideation.  Patient is withdrawn from alcohol before without seizures. Dr.Kaur is his psychiatrist, he had a skype interview with 2 weeks ago; States he is not sure if he is ready to start drinking, but became really anxious after drinking last and came to the ED for evaluation.  He endorses a long standing history for substance abuse, has tried to stop drinking unsuccessuflly.   Past Psychiatric History: depression, anxiety, alcohol abuse  Risk to Self:  no Risk to Others:  no Prior Inpatient Therapy:  yes Prior Outpatient Therapy:  currently see Dr. Toy Care for outpatient psychiatry  Past Medical History:  Past Medical History:  Diagnosis Date   Anxiety    Asthma    childhood asthma   Bipolar disorder ([place])    Depression    GERD (gastroesophageal reflux disease)    Hyperlipidemia    Hypertension    Hypothyroid    Thyroid disease     Past Surgical History:  Procedure Laterality Date   TONSILLECTOMY     Age 89 years old   Family History:  Family History  Problem Relation Age of Onset   Cancer Father    Diabetes Father    Anxiety disorder Mother    Depression Mother    Anxiety disorder Sister    Depression Sister    Family Psychiatric  History: unknown Social History:  Social History   Substance and Sexual Activity  Alcohol Use Yes   Alcohol/week: 12.0 standard  drinks   Types: 12 Cans of beer per week   Comment: relapsed a couple months ago     Social History   Substance and Sexual Activity  Drug Use No    Social History   Socioeconomic History   Marital status: Divorced    Spouse name: Not on file   Number of children: Not on file   Years of education: Not on file   Highest education level: Not on file  Occupational History   Not on file  Tobacco Use   Smoking status: Every Day    Packs/day: 1.00    Years: 5.00    Pack years: 5.00    Types: Cigarettes   Smokeless tobacco: Never  Vaping Use   Vaping Use: Never used  Substance and Sexual Activity   Alcohol use: Yes    Alcohol/week: 12.0 standard drinks    Types: 12 Cans of beer per week    Comment: relapsed a couple months ago   Drug use: No   Sexual activity: Yes  Other Topics Concern   Not on file  Social History Narrative   Not on file   Social Determinants of Health   Financial Resource Strain: Not on file  Food Insecurity: Not on file  Transportation Needs: Not on file  Physical Activity: Not on file  Stress: Not on file  Social Connections: Not on file   Additional Social History:    Allergies:   Allergies  Allergen Reactions   Antihistamines, Diphenhydramine-Type Anxiety and Other (See Comments)    As told by pt and his wife "restless leg syndrome"    Labs:  Results for orders placed or performed during the hospital encounter of 09/13/20 (from the past 48 hour(s))  Rapid urine drug screen (hospital performed)     Status: Abnormal   Collection Time: 09/13/20  5:39 PM  Result Value Ref Range   Opiates NONE DETECTED NONE DETECTED   Cocaine NONE DETECTED NONE DETECTED   Benzodiazepines NONE DETECTED NONE DETECTED   Amphetamines NONE DETECTED NONE DETECTED   Tetrahydrocannabinol POSITIVE (A) NONE DETECTED   Barbiturates NONE DETECTED NONE DETECTED    Comment: (NOTE) DRUG SCREEN FOR MEDICAL PURPOSES ONLY.  IF CONFIRMATION IS NEEDED FOR ANY PURPOSE,  NOTIFY LAB WITHIN 5 DAYS.  LOWEST DETECTABLE LIMITS FOR URINE DRUG SCREEN Drug Class                     Cutoff (ng/mL) Amphetamine and metabolites    1000 Barbiturate and metabolites    200 Benzodiazepine                 622 Tricyclics and metabolites     300 Opiates and metabolites        300 Cocaine and metabolites        300 THC  50 Performed at [facility], [place] [address] [place], [place] [address]   Ethanol     Status: None   Collection Time: 09/13/20  8:00 PM  Result Value Ref Range   Alcohol, Ethyl (B) <10 <10 mg/dL    Comment: (NOTE) Lowest detectable limit for serum alcohol is 10 mg/dL.  For medical purposes only. Performed at [facility], [place] [address], [place], [place] [address]   cbc     Status: Abnormal   Collection Time: 09/13/20  8:00 PM  Result Value Ref Range   WBC 11.0 (H) 4.0 - 10.5 K/uL   RBC 4.35 4.22 - 5.81 MIL/uL   Hemoglobin 14.4 13.0 - 17.0 g/dL   HCT 42.5 39.0 - 52.0 %   MCV 97.7 80.0 - 100.0 fL   MCH 33.1 26.0 - 34.0 pg   MCHC 33.9 30.0 - 36.0 g/dL   RDW 12.7 11.5 - 15.5 %   Platelets 228 150 - 400 K/uL   nRBC 0.0 0.0 - 0.2 %    Comment: Performed at [facility], [place] [address] [place], [place] [address]  Resp Panel by RT-PCR (Flu A&B, Covid) Nasopharyngeal Swab     Status: None   Collection Time: 09/13/20  8:12 PM   Specimen: Nasopharyngeal Swab; Nasopharyngeal(NP) swabs in vial transport medium  Result Value Ref Range   SARS Coronavirus 2 by RT PCR NEGATIVE NEGATIVE    Comment: (NOTE) SARS-CoV-2 target nucleic acids are NOT DETECTED.  The SARS-CoV-2 RNA is generally detectable in upper respiratory specimens during the acute phase of infection. The lowest concentration of SARS-CoV-2 viral copies this assay can detect is 138 copies/mL. A negative result does not preclude SARS-Cov-2 infection and should not be used as the sole basis for treatment or other patient management decisions. A  negative result may occur with  improper specimen collection/handling, submission of specimen other than nasopharyngeal swab, presence of viral mutation(s) within the areas targeted by this assay, and inadequate number of viral copies(<138 copies/mL). A negative result must be combined with clinical observations, patient history, and epidemiological information. The expected result is Negative.  Fact Sheet for Patients:  EntrepreneurPulse.com.au  Fact Sheet for Healthcare Providers:  IncredibleEmployment.be  This test is no t yet approved or cleared by the [place] FDA and  has been authorized for detection and/or diagnosis of SARS-CoV-2 by FDA under an Emergency Use Authorization (EUA). This EUA will remain  in effect (meaning this test can be used) for the duration of the COVID-19 declaration under Section 564(b)(1) of the Act, 21 U.S.C.section 360bbb-3(b)(1), unless the authorization is terminated  or revoked sooner.       Influenza A by PCR NEGATIVE NEGATIVE   Influenza B by PCR NEGATIVE NEGATIVE    Comment: (NOTE) The Xpert Xpress SARS-CoV-2/FLU/RSV plus assay is intended as an aid in the diagnosis of influenza from Nasopharyngeal swab specimens and should not be used as a sole basis for treatment. Nasal washings and aspirates are unacceptable for Xpert Xpress SARS-CoV-2/FLU/RSV testing.  Fact Sheet for Patients: EntrepreneurPulse.com.au  Fact Sheet for Healthcare Providers: IncredibleEmployment.be  This test is not yet approved or cleared by the [place] FDA and has been authorized for detection and/or diagnosis of SARS-CoV-2 by FDA under an Emergency Use Authorization (EUA). This EUA will remain in effect (meaning this test can be used) for the duration of the COVID-19 declaration under Section 564(b)(1) of the Act, 21 U.S.C. section 360bbb-3(b)(1), unless the authorization is  terminated  or revoked.  Performed at [facility], [place] [address] [place], [place] [address]   Comprehensive metabolic panel     Status: Abnormal   Collection Time: 09/13/20  9:45 PM  Result Value Ref Range   Sodium 137 135 - 145 mmol/L   Potassium 4.0 3.5 - 5.1 mmol/L   Chloride 99 98 - 111 mmol/L   CO2 24 22 - 32 mmol/L   Glucose, Bld 98 70 - 99 mg/dL    Comment: Glucose reference range applies only to samples taken after fasting for at least 8 hours.   BUN 8 6 - 20 mg/dL   Creatinine, Ser 0.85 0.61 - 1.24 mg/dL   Calcium 9.0 8.9 - 10.3 mg/dL   Total Protein 7.0 6.5 - 8.1 g/dL   Albumin 4.4 3.5 - 5.0 g/dL   AST 183 (H) 15 - 41 U/L   ALT 261 (H) 0 - 44 U/L   Alkaline Phosphatase 88 38 - 126 U/L   Total Bilirubin 2.0 (H) 0.3 - 1.2 mg/dL   GFR, Estimated >60 >60 mL/min    Comment: (NOTE) Calculated using the CKD-EPI Creatinine Equation (2021)    Anion gap 14 5 - 15    Comment: Performed at [facility], [place] [address] [place], [place] [address]    Medications:  Current Facility-Administered Medications  Medication Dose Route Frequency Provider Last Rate Last Admin   nicotine (NICODERM CQ - dosed in mg/24 hours) patch 14 mg  14 mg Transdermal Daily Horton, Kristie M, DO   14 mg at 09/13/20 2249   Current Outpatient Medications  Medication Sig Dispense Refill   amphetamine-dextroamphetamine (ADDERALL) 30 MG tablet Take 30 mg by mouth 2 (two) times daily.     doxepin (SINEQUAN) 10 MG capsule Take 10 mg by mouth at bedtime.     gabapentin (NEURONTIN) 400 MG capsule Take 400 mg by mouth 3 (three) times daily.     hydrOXYzine (VISTARIL) 25 MG capsule Take 25 mg by mouth 4 (four) times daily as needed. Withdrawal symptoms     levothyroxine (SYNTHROID, LEVOTHROID) 25 MCG tablet Take 1 tablet (25 mcg total) by mouth daily before breakfast. 30 tablet 0   Multiple Vitamin (MULTIVITAMIN WITH MINERALS) TABS tablet Take 1 tablet by mouth daily.     gabapentin (NEURONTIN) 300 MG  capsule Take 1 capsule (300 mg total) by mouth 3 (three) times daily. (Patient not taking: No sig reported) 90 capsule 0   prazosin (MINIPRESS) 1 MG capsule TAKE 6 CAPSULES  6 MG TOTAL  BY MOUTH AT BEDTIME  0   REXULTI 3 MG TABS Take 1 tablet by mouth daily.  4   VYVANSE 70 MG capsule Take 70 mg by mouth daily.   0    Musculoskeletal: Strength & Muscle Tone: within normal limits Gait & Station: normal Patient leans: N/A  Psychiatric Specialty Exam:  Presentation  General Appearance: Appropriate for Environment; Casual  Eye Contact:Good  Speech:Clear and Coherent; Normal Rate  Speech Volume:Normal  Handedness:Right   Mood and Affect  Mood:-- (mildly anxious)  Affect:Appropriate; Congruent   Thought Process  Thought Processes:Coherent; Goal Directed  Descriptions of Associations:Intact  Orientation:Full (Time, Place and Person)  Thought Content:Logical  History of Schizophrenia/Schizoaffective disorder:No  Duration of Psychotic Symptoms:N/A  Hallucinations:Hallucinations: None Ideas of Reference:None  Suicidal Thoughts:Suicidal Thoughts: No Homicidal Thoughts:Homicidal Thoughts: No  Sensorium  Memory:Immediate Good; Recent Good; Remote Good  Judgment:Fair (baseline in the setting of continued alcohol use/abuse)  Insight:Lacking (  baseline)   Psychiatric nurse (patient looks at the camera, responds to questions appropriately,)  Attention Span:Fair  [place]  Language:Good   Psychomotor Activity  Psychomotor Activity: Psychomotor Activity: Normal; Other (comment) (no hand tremors)  Assets  Assets:Communication Skills; Housing; Social Support (has mother whom he reports as good social support)   Sleep  Sleep: Sleep: Good Number of Hours of Sleep: 8   Physical Exam: Physical Exam HENT:     Head: Normocephalic.  Cardiovascular:     Rate and Rhythm: Normal rate.     Pulses: Normal pulses.   Pulmonary:     Effort: Pulmonary effort is normal.  Musculoskeletal:        General: Normal range of motion.     Cervical back: Normal range of motion.  Neurological:     Mental Status: He is alert and oriented to person, place, and time.  Psychiatric:        Mood and Affect: Mood normal.        Behavior: Behavior normal.        Thought Content: Thought content normal.        Cognition and Memory: Cognition and memory normal.        Judgment: Judgment is impulsive (at baseline due to alcohol abuse/use).   Review of Systems  Constitutional: Negative.   HENT: Negative.    Eyes: Negative.   Respiratory: Negative.    Cardiovascular: Negative.   Gastrointestinal:  Negative for diarrhea, nausea and vomiting.  Genitourinary: Negative.   Musculoskeletal: Negative.   Skin: Negative.   Neurological:  Negative for dizziness, tremors, seizures and headaches.  Endo/Heme/Allergies: Negative.   Psychiatric/Behavioral:  Positive for depression and substance abuse. Negative for hallucinations, memory loss and suicidal ideas. The patient is nervous/anxious. The patient does not have insomnia.   Blood pressure (!) 149/97, pulse 91, temperature 98.7 F (37.1 C), temperature source Oral, resp. rate 15, height 5\' 8"  (1.727 m), weight 68 kg, SpO2 98 %. Body mass index is 22.81 kg/m.  Treatment Plan Summary: Plan- As per above assessment, there are no current grounds for involuntary commitment at this time. The patient is future oriented and relays his plan to follow-up with his psychiatrist Dr. Toy Care for continued outpatient care.    Patient is not currently interested in inpatient services, but expresses agreement to continue outpatient treatment - we have reviewed the importance alcohol abstinence, and medication compliance for optimal functioning. He has gabapentin and hydroxyzine as a part of his home regimen and agrees to continue with her anxiety until he can follow-up with Dr. Toy Care. He accepts  resources for outpatient detox but remains in the contemplation stage and unsure if he will use referrals. I have asked Christean Leaf, [facility] SW for assistance in detox referral for this patient.  Disposition: No evidence of imminent risk to self or others at present.   Patient does not meet criteria for psychiatric inpatient admission. Supportive therapy provided about ongoing stressors. Discussed crisis plan, support from social network, calling 911, coming to the Emergency Department, and calling Suicide Hotline.  This service was provided via telemedicine using a 2-way, interactive audio and video technology.  Names of all persons participating in this telemedicine service and their role in this encounter. Name: Nichlas L. Ketterman Dean Role: Patient  Name: [name] Role: [place]  Name: Hampton Abbot Role: Psychiatrist    Mallie Darting, NP 09/14/2020 12:50 PM

## 2020-09-14 NOTE — ED Provider Notes (Signed)
Emergency Medicine Observation Re-evaluation Note  Zachary Dean is a 40 y.o. male, seen on rounds today.  Pt initially presented to the ED for complaints of Withdrawal Currently, the patient is laying in bed waiting for re-evaluation.  Physical Exam  BP (!) 155/105   Pulse 93   Temp 98.7 F (37.1 C) (Oral)   Resp (!) 22   Ht 5\' 8"  (1.727 m)   Wt 68 kg   SpO2 99%   BMI 22.81 kg/m  Physical Exam General: NAD Cardiac: regular rate Lungs: clear Psych: denies SI or HI  ED Course / MDM  EKG:EKG Interpretation  Date/Time:  Friday September 13 2020 20:08:43 EDT Ventricular Rate:  84 PR Interval:  159 QRS Duration: 101 QT Interval:  387 QTC Calculation: 458 R Axis:   196 Text Interpretation: Sinus rhythm Inferior infarct, acute (LCx) Minimal ST elevation, anterior leads Lateral leads are also involved >>> Acute MI <<< Similar to previous, disagree for acute MI Confirmed by Lavenia Atlas [phone]) on 09/13/2020 8:21:11 PM  I have reviewed the labs performed to date as well as medications administered while in observation.  Recent changes in the last 24 hours include no .  Plan  Current plan is for psych evaluated this morning.  Zachary Dean evaluated the patient and at this time pt does not meet inpt criteria and he does not wish to stay but wants to f/u with his outpt psychiatrist.  Pt denies SI/HI.  Pt given outpt substance abuse resources. Patient is not under full IVC at this time.   Zachary Dessert, MD 09/14/20 1146

## 2020-09-14 NOTE — ED Notes (Signed)
Tele psych machine to bedside. Pt mother requesting update, this RN notified psych NP Jerelene Redden.
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
## 2021-08-04 ENCOUNTER — Other Ambulatory Visit: Payer: Self-pay

## 2021-08-04 ENCOUNTER — Encounter (HOSPITAL_COMMUNITY): Payer: Self-pay | Admitting: Emergency Medicine

## 2021-08-04 ENCOUNTER — Emergency Department (HOSPITAL_COMMUNITY)
Admission: EM | Admit: 2021-08-04 | Discharge: 2021-08-05 | Disposition: A | Discharge status: Other Acute Inpatient Hospital | Payer: Self-pay | Attending: Emergency Medicine | Admitting: Emergency Medicine

## 2021-08-04 DIAGNOSIS — Z79899 Other long term (current) drug therapy: Secondary | ICD-10-CM | POA: Insufficient documentation

## 2021-08-04 DIAGNOSIS — Z20822 Contact with and (suspected) exposure to covid-19: Secondary | ICD-10-CM | POA: Insufficient documentation

## 2021-08-04 DIAGNOSIS — F331 Major depressive disorder, recurrent, moderate: Secondary | ICD-10-CM | POA: Insufficient documentation

## 2021-08-04 DIAGNOSIS — F29 Unspecified psychosis not due to a substance or known physiological condition: Secondary | ICD-10-CM | POA: Insufficient documentation

## 2021-08-04 DIAGNOSIS — F23 Brief psychotic disorder: Secondary | ICD-10-CM

## 2021-08-04 DIAGNOSIS — Y906 Blood alcohol level of 120-199 mg/100 ml: Secondary | ICD-10-CM | POA: Insufficient documentation

## 2021-08-04 DIAGNOSIS — J45909 Unspecified asthma, uncomplicated: Secondary | ICD-10-CM | POA: Insufficient documentation

## 2021-08-04 DIAGNOSIS — E039 Hypothyroidism, unspecified: Secondary | ICD-10-CM | POA: Insufficient documentation
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
## 2021-08-05 ENCOUNTER — Ambulatory Visit (HOSPITAL_COMMUNITY)
Admission: EM | Admit: 2021-08-05 | Discharge: 2021-08-05 | Disposition: A | Discharge status: Home / Self Care | Payer: No Payment, Other | Attending: Psychiatry | Admitting: Psychiatry

## 2021-08-05 ENCOUNTER — Encounter (HOSPITAL_COMMUNITY): Payer: Self-pay | Admitting: Registered Nurse

## 2021-08-05 DIAGNOSIS — Z9151 Personal history of suicidal behavior: Secondary | ICD-10-CM | POA: Insufficient documentation

## 2021-08-05 DIAGNOSIS — Z6379 Other stressful life events affecting family and household: Secondary | ICD-10-CM | POA: Insufficient documentation

## 2021-08-05 DIAGNOSIS — Z046 Encounter for general psychiatric examination, requested by authority: Secondary | ICD-10-CM

## 2021-08-05 DIAGNOSIS — F1090 Alcohol use, unspecified, uncomplicated: Secondary | ICD-10-CM

## 2021-08-05 DIAGNOSIS — F22 Delusional disorders: Secondary | ICD-10-CM | POA: Insufficient documentation

## 2021-08-05 DIAGNOSIS — F19950 Other psychoactive substance use, unspecified with psychoactive substance-induced psychotic disorder with delusions: Secondary | ICD-10-CM | POA: Diagnosis present

## 2021-08-05 DIAGNOSIS — F419 Anxiety disorder, unspecified: Secondary | ICD-10-CM | POA: Insufficient documentation

## 2021-08-05 DIAGNOSIS — F1010 Alcohol abuse, uncomplicated: Secondary | ICD-10-CM | POA: Diagnosis present

## 2021-08-05 DIAGNOSIS — F411 Generalized anxiety disorder: Secondary | ICD-10-CM | POA: Diagnosis present

## 2021-08-05 DIAGNOSIS — F10120 Alcohol abuse with intoxication, uncomplicated: Secondary | ICD-10-CM | POA: Insufficient documentation

## 2021-08-05 DIAGNOSIS — F1910 Other psychoactive substance abuse, uncomplicated: Secondary | ICD-10-CM | POA: Diagnosis present

## 2021-08-05 DIAGNOSIS — I10 Essential (primary) hypertension: Secondary | ICD-10-CM | POA: Insufficient documentation

## 2021-08-05 DIAGNOSIS — F32A Depression, unspecified: Secondary | ICD-10-CM | POA: Insufficient documentation

## 2021-08-05 DIAGNOSIS — Y906 Blood alcohol level of 120-199 mg/100 ml: Secondary | ICD-10-CM | POA: Insufficient documentation

## 2021-08-05 LAB — RAPID URINE DRUG SCREEN, HOSP PERFORMED
Amphetamines: POSITIVE — AB
Barbiturates: NOT DETECTED
Benzodiazepines: NOT DETECTED
Cocaine: NOT DETECTED
Opiates: NOT DETECTED
Tetrahydrocannabinol: POSITIVE — AB

## 2021-08-05 LAB — COMPREHENSIVE METABOLIC PANEL
ALT: 115 U/L — ABNORMAL HIGH (ref 0–44)
AST: 92 U/L — ABNORMAL HIGH (ref 15–41)
Albumin: 4 g/dL (ref 3.5–5.0)
Alkaline Phosphatase: 95 U/L (ref 38–126)
Anion gap: 11 (ref 5–15)
BUN: 11 mg/dL (ref 6–20)
CO2: 23 mmol/L (ref 22–32)
Calcium: 9 mg/dL (ref 8.9–10.3)
Chloride: 109 mmol/L (ref 98–111)
Creatinine, Ser: 1.07 mg/dL (ref 0.61–1.24)
GFR, Estimated: >60 mL/min (ref >60)
Glucose, Bld: 107 mg/dL — ABNORMAL HIGH (ref 70–99)
Potassium: 4.2 mmol/L (ref 3.5–5.1)
Sodium: 143 mmol/L (ref 135–145)
Total Bilirubin: 0.9 mg/dL (ref 0.3–1.2)
Total Protein: 6.8 g/dL (ref 6.5–8.1)

## 2021-08-05 LAB — CBC
HCT: 43.3 % (ref 39.0–52.0)
Hemoglobin: 14.8 g/dL (ref 13.0–17.0)
MCH: 33.5 pg (ref 26.0–34.0)
MCHC: 34.2 g/dL (ref 30.0–36.0)
MCV: 98 fL (ref 80.0–100.0)
Platelets: 244 10*3/uL (ref 150–400)
RBC: 4.42 MIL/uL (ref 4.22–5.81)
RDW: 12.8 % (ref 11.5–15.5)
WBC: 11.5 10*3/uL — ABNORMAL HIGH (ref 4.0–10.5)
nRBC: 0 % (ref 0.0–0.2)

## 2021-08-05 LAB — TSH: TSH: 3.736 u[IU]/mL (ref 0.350–4.500)

## 2021-08-05 LAB — RESP PANEL BY RT-PCR (FLU A&B, COVID) ARPGX2
Influenza A by PCR: NEGATIVE
Influenza B by PCR: NEGATIVE
SARS Coronavirus 2 by RT PCR: NEGATIVE

## 2021-08-05 LAB — SALICYLATE LEVEL: Salicylate Lvl: <7 mg/dL — ABNORMAL LOW (ref 7.0–30.0)

## 2021-08-05 LAB — ACETAMINOPHEN LEVEL: Acetaminophen (Tylenol), Serum: <10 ug/mL — ABNORMAL LOW (ref 10–30)

## 2021-08-05 LAB — ETHANOL: Alcohol, Ethyl (B): 153 mg/dL — ABNORMAL HIGH (ref <10)

## 2021-08-05 MED ORDER — THIAMINE HCL 100 MG/ML IJ SOLN: 100.0000 mg | Freq: Every day | INTRAMUSCULAR | Status: DC

## 2021-08-05 MED ORDER — LORAZEPAM 2 MG/ML IJ SOLN: 0.0000 mg | Freq: Two times a day (BID) | INTRAMUSCULAR | Status: DC

## 2021-08-05 MED ORDER — LORAZEPAM 1 MG PO TABS: 0.0000 mg | ORAL_TABLET | Freq: Four times a day (QID) | ORAL | Status: DC

## 2021-08-05 MED ORDER — CHLORDIAZEPOXIDE HCL 25 MG PO CAPS
25.0000 mg | ORAL_CAPSULE | Freq: Once | ORAL | Status: AC
  Administered 2021-08-05: 25 mg via ORAL
  Filled 2021-08-05: qty 1

## 2021-08-05 MED ORDER — LORAZEPAM 2 MG/ML IJ SOLN: 0.0000 mg | Freq: Four times a day (QID) | INTRAMUSCULAR | Status: DC

## 2021-08-05 MED ORDER — ONDANSETRON 4 MG PO TBDP
4.0000 mg | ORAL_TABLET | Freq: Once | ORAL | Status: AC
  Administered 2021-08-05: 4 mg via ORAL
  Filled 2021-08-05: qty 1

## 2021-08-05 MED ORDER — LORAZEPAM 1 MG PO TABS: 0.0000 mg | ORAL_TABLET | Freq: Two times a day (BID) | ORAL | Status: DC

## 2021-08-05 MED ORDER — LORAZEPAM 1 MG PO TABS
1.0000 mg | ORAL_TABLET | Freq: Once | ORAL | Status: AC
  Administered 2021-08-05: 1 mg via ORAL
  Filled 2021-08-05: qty 1

## 2021-08-05 MED ORDER — THIAMINE HCL 100 MG PO TABS: 100.0000 mg | ORAL_TABLET | Freq: Every day | ORAL | Status: DC

## 2021-08-05 NOTE — ED Notes (Signed)
Breakfast order placed ?

## 2021-08-05 NOTE — Progress Notes (Signed)
Patient's Blood pressure is elevated, 172/121 from [facility].  Providers notified via secure chat. Manual BP will be collected. Manually 180/90. ?
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
## 2021-08-05 NOTE — ED Notes (Signed)
Belongings returned to pt for transport to [facility] ?

## 2021-08-05 NOTE — ED Notes (Signed)
Patient discharged from Observation unit with community resources for substance abuse. Patient informed to follow up with PCP about hypertension. Patient received all belongings from [facility] locker. Patient denies SI, HI and AVH at time of discharge. Pain 0/10. Blood pressure 160/88, temperature 98.4, pulse of 86 at time of discharge. ?
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
## 2021-08-05 NOTE — Discharge Instructions (Addendum)
Discharge recommendations:  ?Follow up with your outpatient psychiatrist, Dr. Toy Care for medication management.   ?Patient is to take medications as prescribed. ? ?Please follow up with your primary care provider for all medical related needs.  ? ?Medications: The parent/guardian is to contact a medical professional and/or outpatient provider to address any new side effects that develop. Parent/guardian should update outpatient providers of any new medications and/or medication changes.  ? ?Substance use treatment:  ?See substance use resources list for treatment options.  ? ?Safety:  ?The patient should abstain from use of illicit substances/drugs and abuse of any medications. ?If symptoms worsen or do not continue to improve or if the patient becomes actively suicidal or homicidal then it is recommended that the patient return to the closest hospital emergency department, the [facility], or call 911 for further evaluation and treatment. ?National Suicide Prevention Lifeline 1-800-SUICIDE or 586-339-7873. ? ?About 988 ?988 offers 24/7 access to trained crisis counselors who can help people experiencing mental health-related distress. People can call or text 988 or chat 988lifeline.org for themselves or if they are worried about a loved one who may need crisis support.  ? ? ?  ?

## 2021-08-05 NOTE — ED Notes (Signed)
Report given to Isaias Sakai, RN at [facility]. ?

## 2021-08-05 NOTE — ED Provider Notes (Addendum)
?[place] ?Provider Note ? ? ?CSN: [id number] ?Arrival date & time: 08/04/21  2329 ? ?  ? ?History ? ?Chief Complaint  ?Patient presents with  ? Psychiatric Evaluation  ? Alcohol Problem  ? ? ?Zachary Dean is a 41 y.o. male. ? ?HPI ?41 year old male with a history of bipolar disorder, hypothyroidism, asthma, depression, hyperlipidemia, GERD presents to the ER under IVC with [employer].  The patient reports that he had been drinking alcohol and got angry.  He reports that he drinks beer daily.  Reports noncompliance with medications.  Per his mother [name], she stated that around 4 PM he started drinking and was screaming and hollering outside of the porch, was so loud that the neighbors called the police.  He also was reportedly shooting a gun into the air.  He endorses THC use.  Denies any SI, HI, no auditory visual hallucinations.  Denies any history of alcohol withdrawal requiring hospitalization. ? ? ?  ? ?Home Medications ?Prior to Admission medications   ?Medication Sig Start Date End Date Taking? Authorizing Provider  ?amphetamine-dextroamphetamine (ADDERALL) 30 MG tablet Take 30 mg by mouth 2 (two) times daily.    [provider]  ?doxepin (SINEQUAN) 10 MG capsule Take 10 mg by mouth at bedtime. 08/01/20   [provider]  ?gabapentin (NEURONTIN) 300 MG capsule Take 1 capsule (300 mg total) by mouth 3 (three) times daily. ?Patient not taking: No sig reported 07/11/16   Suella Broad, FNP  ?gabapentin (NEURONTIN) 400 MG capsule Take 400 mg by mouth 3 (three) times daily. 08/14/20   [provider]  ?hydrOXYzine (VISTARIL) 25 MG capsule Take 25 mg by mouth 4 (four) times daily as needed. Withdrawal symptoms 09/05/20   [provider]  ?levothyroxine (SYNTHROID, LEVOTHROID) 25 MCG tablet Take 1 tablet (25 mcg total) by mouth daily before breakfast. 07/12/16   Burt Ek, Gayland Curry, FNP  ?Multiple Vitamin (MULTIVITAMIN  WITH MINERALS) TABS tablet Take 1 tablet by mouth daily.    [provider]  ?prazosin (MINIPRESS) 1 MG capsule TAKE 6 CAPSULES  6 MG TOTAL  BY MOUTH AT BEDTIME 08/21/16   [provider]  ?REXULTI 3 MG TABS Take 1 tablet by mouth daily. 09/07/16   [provider]  ?VYVANSE 70 MG capsule Take 70 mg by mouth daily.  08/25/16   [provider]  ?   ? ?Allergies    ?Antihistamines, diphenhydramine-type   ? ?Review of Systems   ?Review of Systems ?Ten systems reviewed and are negative for acute change, except as noted in the HPI.  ? ?Physical Exam ?Updated Vital Signs ?BP (!) 154/99   Pulse (!) 102   Temp 98.5 ?F (36.9 ?C) (Oral)   Resp 18   SpO2 98%  ?Physical Exam ?Vitals and nursing note reviewed.  ?Constitutional:   ?   General: He is not in acute distress. ?   Appearance: He is well-developed.  ?HENT:  ?   Head: Normocephalic and atraumatic.  ?Eyes:  ?   Conjunctiva/sclera: Conjunctivae normal.  ?Cardiovascular:  ?   Rate and Rhythm: Normal rate and regular rhythm.  ?   Heart sounds: No murmur heard. ?Pulmonary:  ?   Effort: Pulmonary effort is normal. No respiratory distress.  ?   Breath sounds: Normal breath sounds.  ?Abdominal:  ?   Palpations: Abdomen is soft.  ?   Tenderness: There is no abdominal tenderness.  ?Musculoskeletal:     ?  General: No swelling.  ?   Cervical back: Neck supple.  ?Skin: ?   General: Skin is warm and dry.  ?   Capillary Refill: Capillary refill takes less than 2 seconds.  ?Neurological:  ?   Mental Status: He is alert.  ?Psychiatric:     ?   Mood and Affect: Mood normal.  ?   Comments: Calm, cooperative, answering questions appropriately.  Not responding to external stimuli.  ? ? ?ED Results / Procedures / Treatments   ?Labs ?(all labs ordered are listed, but only abnormal results are displayed) ?Labs Reviewed  ?COMPREHENSIVE METABOLIC PANEL - Abnormal; Notable for the following components:  ?    Result Value  ? Glucose, Bld 107 (*)   ? AST 92 (*)    ? ALT 115 (*)   ? All other components within normal limits  ?ETHANOL - Abnormal; Notable for the following components:  ? Alcohol, Ethyl (B) 153 (*)   ? All other components within normal limits  ?SALICYLATE LEVEL - Abnormal; Notable for the following components:  ? Salicylate Lvl <8.1 (*)   ? All other components within normal limits  ?ACETAMINOPHEN LEVEL - Abnormal; Notable for the following components:  ? Acetaminophen (Tylenol), Serum <10 (*)   ? All other components within normal limits  ?CBC - Abnormal; Notable for the following components:  ? WBC 11.5 (*)   ? All other components within normal limits  ?RAPID URINE DRUG SCREEN, HOSP PERFORMED - Abnormal; Notable for the following components:  ? Amphetamines POSITIVE (*)   ? Tetrahydrocannabinol POSITIVE (*)   ? All other components within normal limits  ?RESP PANEL BY RT-PCR (FLU A&B, COVID) ARPGX2  ?TSH  ? ? ?EKG ?EKG Interpretation ? ?Date/Time:  Monday Aug 04 2021 23:32:56 EDT ?Ventricular Rate:  100 ?PR Interval:  164 ?QRS Duration: 90 ?QT Interval:  338 ?QTC Calculation: 436 ?R Axis:   141 ?Text Interpretation: Normal sinus rhythm Possible Right ventricular hypertrophy Similar to prior Abnormal ECG When compared with ECG of 13-Sep-2020 20:08, PREVIOUS ECG IS PRESENT Confirmed by Thayer Jew [phone]) on 08/05/2021 2:43:09 AM ? ?Radiology ?No results found. ? ?Procedures ?Procedures  ? ? ?Medications Ordered in ED ?Medications  ?LORazepam (ATIVAN) injection 0-4 mg (0 mg Intravenous Not Given 08/05/21 0522)  ?  Or  ?LORazepam (ATIVAN) tablet 0-4 mg ( Oral See Alternative 08/05/21 0522)  ?LORazepam (ATIVAN) injection 0-4 mg (has no administration in time range)  ?  Or  ?LORazepam (ATIVAN) tablet 0-4 mg (has no administration in time range)  ?thiamine tablet 100 mg (has no administration in time range)  ?  Or  ?thiamine (B-1) injection 100 mg (has no administration in time range)  ? ? ?ED Course/ Medical Decision Making/ A&P ?  ? ?                         ?Medical Decision Making ?Amount and/or Complexity of Data Reviewed ?Labs: ordered. Decision-making details documented in ED Course. ? ?Risk ?OTC drugs. ?Prescription drug management. ? ?Patient presents to the ER under IVC by [employer]. H/o Bipolar disorder, non compliant with meds. Reportedly was yelling and shooting a gun into the air. FIRST exam filled out.  Requiring medical current clearance for evaluation by psychiatry.  On presentation, the patient is calm, cooperative. Hypertensive w/ a BP 188/122, mildy tachycardic, however no signs of hypertensive urgency/emergency. Suspect tachycardia/HTN 2/2 ethanol, UDS, THC.  I ordered, reviewed and interpreted his  labs.  CBC w/ a leukocytosis, no fever, likely reactive to drug use. CMP w/o electrolyte abnormalities, normal renal function. LFTS elevated suspect 2/2 chronic alcohol use. No abdominal pain.   Negative acetaminophen, salicylate, ethanol 366.  UDS positive for amphetamines and THC. EKG originally reading STEMI, reviewed by Dr. Dina Rich, appears similar to prior, patient has no chest pain.  CIWA protocol initiated. He  has been medically cleared for further evaluation by TTS.  Dispo according to the recommendation. ? ?Addendum: Pt accepted at [facility]. EMTALA form filled out. Spoke with Evette Georges NP for signout. Will transfer after 7AM as there will be no provider until then per discussion with him.  ? ?Final Clinical Impression(s) / ED Diagnoses ?Final diagnoses:  ?Acute psychosis ([place])  ? ? ?Rx / DC Orders ?ED Discharge Orders   ? ? None  ? ?  ? ? ?  ?Garald Balding, PA-C ?08/05/21 0246 ? ?  ?Garald Balding, PA-C ?08/05/21 0534 ? ?  ?Merryl Hacker, MD ?08/05/21 [phone] ? ?

## 2021-08-05 NOTE — ED Notes (Signed)
Called report to [place] at [facility], he requested that d/t pt not being able to go til after 7am that the day shift nurse also call after 0700 to give additional report to the day shift at [facility].  ?
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
